# Patient Record
Sex: Female | Born: 1950 | Race: White | Hispanic: No | Marital: Married
Health system: Southern US, Community
[De-identification: ages and names within clinical notes are randomized; demographics above are authoritative.]

## PROBLEM LIST (undated history)

## (undated) DIAGNOSIS — K219 Gastro-esophageal reflux disease without esophagitis: Secondary | ICD-10-CM

## (undated) DIAGNOSIS — J45909 Unspecified asthma, uncomplicated: Secondary | ICD-10-CM

## (undated) DIAGNOSIS — M069 Rheumatoid arthritis, unspecified: Secondary | ICD-10-CM

## (undated) DIAGNOSIS — D509 Iron deficiency anemia, unspecified: Secondary | ICD-10-CM

## (undated) DIAGNOSIS — E039 Hypothyroidism, unspecified: Secondary | ICD-10-CM

## (undated) DIAGNOSIS — F411 Generalized anxiety disorder: Secondary | ICD-10-CM

---

## 2013-01-04 HISTORY — PX: BACK SURGERY: SHX140

## 2013-10-31 DIAGNOSIS — M1990 Unspecified osteoarthritis, unspecified site: Secondary | ICD-10-CM | POA: Insufficient documentation

## 2013-10-31 DIAGNOSIS — G4700 Insomnia, unspecified: Secondary | ICD-10-CM | POA: Insufficient documentation

## 2013-10-31 DIAGNOSIS — M5136 Other intervertebral disc degeneration, lumbar region: Secondary | ICD-10-CM | POA: Insufficient documentation

## 2013-10-31 DIAGNOSIS — I10 Essential (primary) hypertension: Secondary | ICD-10-CM | POA: Insufficient documentation

## 2013-10-31 DIAGNOSIS — J45909 Unspecified asthma, uncomplicated: Secondary | ICD-10-CM | POA: Insufficient documentation

## 2013-12-05 ENCOUNTER — Ambulatory Visit: Payer: Federal, State, Local not specified - PPO | Attending: Family Medicine | Admitting: Physical Therapy

## 2013-12-05 DIAGNOSIS — N8189 Other female genital prolapse: Secondary | ICD-10-CM | POA: Diagnosis present

## 2013-12-11 ENCOUNTER — Ambulatory Visit: Payer: Federal, State, Local not specified - PPO | Admitting: Physical Therapy

## 2013-12-18 ENCOUNTER — Ambulatory Visit: Payer: Federal, State, Local not specified - PPO | Admitting: Physical Therapy

## 2013-12-18 DIAGNOSIS — N8189 Other female genital prolapse: Secondary | ICD-10-CM | POA: Diagnosis not present

## 2013-12-24 ENCOUNTER — Ambulatory Visit: Payer: Federal, State, Local not specified - PPO | Admitting: Physical Therapy

## 2014-01-01 ENCOUNTER — Ambulatory Visit: Payer: Federal, State, Local not specified - PPO | Admitting: Physical Therapy

## 2014-01-08 ENCOUNTER — Ambulatory Visit: Payer: Federal, State, Local not specified - PPO | Attending: Family Medicine | Admitting: Physical Therapy

## 2014-01-08 DIAGNOSIS — N8189 Other female genital prolapse: Secondary | ICD-10-CM | POA: Insufficient documentation

## 2014-01-22 ENCOUNTER — Ambulatory Visit: Payer: Federal, State, Local not specified - PPO | Admitting: Physical Therapy

## 2014-01-22 DIAGNOSIS — N8189 Other female genital prolapse: Secondary | ICD-10-CM | POA: Diagnosis not present

## 2014-02-04 ENCOUNTER — Ambulatory Visit: Payer: Federal, State, Local not specified - PPO | Attending: Family Medicine | Admitting: Physical Therapy

## 2014-02-04 DIAGNOSIS — N8189 Other female genital prolapse: Secondary | ICD-10-CM | POA: Insufficient documentation

## 2014-02-07 ENCOUNTER — Other Ambulatory Visit: Payer: Self-pay | Admitting: Orthopaedic Surgery

## 2014-02-07 DIAGNOSIS — M4716 Other spondylosis with myelopathy, lumbar region: Secondary | ICD-10-CM

## 2014-02-07 DIAGNOSIS — M4316 Spondylolisthesis, lumbar region: Secondary | ICD-10-CM

## 2014-02-07 DIAGNOSIS — M545 Low back pain: Secondary | ICD-10-CM

## 2014-02-18 ENCOUNTER — Encounter: Payer: Federal, State, Local not specified - PPO | Admitting: Physical Therapy

## 2014-02-25 ENCOUNTER — Ambulatory Visit
Admission: RE | Admit: 2014-02-25 | Discharge: 2014-02-25 | Disposition: A | Discharge status: Home / Self Care | Payer: Federal, State, Local not specified - PPO | Source: Ambulatory Visit | Attending: Orthopaedic Surgery | Admitting: Orthopaedic Surgery

## 2014-02-25 DIAGNOSIS — M4316 Spondylolisthesis, lumbar region: Secondary | ICD-10-CM

## 2014-02-25 DIAGNOSIS — M4716 Other spondylosis with myelopathy, lumbar region: Secondary | ICD-10-CM

## 2014-02-25 DIAGNOSIS — M545 Low back pain: Secondary | ICD-10-CM

## 2014-03-04 ENCOUNTER — Ambulatory Visit: Payer: Federal, State, Local not specified - PPO | Admitting: Physical Therapy

## 2014-03-18 ENCOUNTER — Encounter: Payer: Federal, State, Local not specified - PPO | Admitting: Physical Therapy

## 2014-08-21 DIAGNOSIS — K219 Gastro-esophageal reflux disease without esophagitis: Secondary | ICD-10-CM | POA: Insufficient documentation

## 2014-08-21 DIAGNOSIS — R1310 Dysphagia, unspecified: Secondary | ICD-10-CM | POA: Insufficient documentation

## 2014-10-30 DIAGNOSIS — N3000 Acute cystitis without hematuria: Secondary | ICD-10-CM | POA: Insufficient documentation

## 2015-01-23 ENCOUNTER — Ambulatory Visit (HOSPITAL_COMMUNITY): Payer: Federal, State, Local not specified - PPO

## 2015-01-23 ENCOUNTER — Emergency Department (HOSPITAL_COMMUNITY): Payer: Federal, State, Local not specified - PPO

## 2015-01-23 ENCOUNTER — Observation Stay (HOSPITAL_COMMUNITY)
Admission: EM | Admit: 2015-01-23 | Discharge: 2015-01-25 | Disposition: A | Discharge status: Home / Self Care | Payer: Federal, State, Local not specified - PPO | Attending: Internal Medicine | Admitting: Internal Medicine

## 2015-01-23 ENCOUNTER — Encounter (HOSPITAL_COMMUNITY): Payer: Self-pay | Admitting: Emergency Medicine

## 2015-01-23 DIAGNOSIS — M549 Dorsalgia, unspecified: Secondary | ICD-10-CM | POA: Diagnosis not present

## 2015-01-23 DIAGNOSIS — F323 Major depressive disorder, single episode, severe with psychotic features: Secondary | ICD-10-CM

## 2015-01-23 DIAGNOSIS — F99 Mental disorder, not otherwise specified: Secondary | ICD-10-CM | POA: Diagnosis not present

## 2015-01-23 DIAGNOSIS — R4182 Altered mental status, unspecified: Principal | ICD-10-CM | POA: Diagnosis present

## 2015-01-23 DIAGNOSIS — E039 Hypothyroidism, unspecified: Secondary | ICD-10-CM | POA: Diagnosis not present

## 2015-01-23 DIAGNOSIS — M069 Rheumatoid arthritis, unspecified: Secondary | ICD-10-CM | POA: Diagnosis not present

## 2015-01-23 DIAGNOSIS — J45909 Unspecified asthma, uncomplicated: Secondary | ICD-10-CM | POA: Insufficient documentation

## 2015-01-23 DIAGNOSIS — E876 Hypokalemia: Secondary | ICD-10-CM | POA: Diagnosis not present

## 2015-01-23 DIAGNOSIS — R1110 Vomiting, unspecified: Secondary | ICD-10-CM | POA: Diagnosis not present

## 2015-01-23 DIAGNOSIS — M25462 Effusion, left knee: Secondary | ICD-10-CM

## 2015-01-23 DIAGNOSIS — Y92230 Patient room in hospital as the place of occurrence of the external cause: Secondary | ICD-10-CM | POA: Diagnosis not present

## 2015-01-23 DIAGNOSIS — R45851 Suicidal ideations: Secondary | ICD-10-CM | POA: Diagnosis not present

## 2015-01-23 DIAGNOSIS — F22 Delusional disorders: Secondary | ICD-10-CM | POA: Diagnosis not present

## 2015-01-23 DIAGNOSIS — Z87891 Personal history of nicotine dependence: Secondary | ICD-10-CM | POA: Diagnosis not present

## 2015-01-23 DIAGNOSIS — G8929 Other chronic pain: Secondary | ICD-10-CM | POA: Diagnosis not present

## 2015-01-23 DIAGNOSIS — W010XXA Fall on same level from slipping, tripping and stumbling without subsequent striking against object, initial encounter: Secondary | ICD-10-CM | POA: Insufficient documentation

## 2015-01-23 DIAGNOSIS — R40243 Glasgow coma scale score 3-8, unspecified time: Secondary | ICD-10-CM

## 2015-01-23 HISTORY — DX: Unspecified asthma, uncomplicated: J45.909

## 2015-01-23 HISTORY — DX: Rheumatoid arthritis, unspecified: M06.9

## 2015-01-23 LAB — COMPREHENSIVE METABOLIC PANEL
ALBUMIN: 3.9 g/dL (ref 3.5–5.0)
ALK PHOS: 117 U/L (ref 38–126)
ALT: 19 U/L (ref 14–54)
AST: 24 U/L (ref 15–41)
Anion gap: 10 (ref 5–15)
BILIRUBIN TOTAL: 0.2 mg/dL — AB (ref 0.3–1.2)
BUN: 15 mg/dL (ref 6–20)
CALCIUM: 10.6 mg/dL — AB (ref 8.9–10.3)
CO2: 27 mmol/L (ref 22–32)
CREATININE: 0.67 mg/dL (ref 0.44–1.00)
Chloride: 107 mmol/L (ref 101–111)
GFR calc Af Amer: >60 mL/min (ref >60)
GFR calc non Af Amer: >60 mL/min (ref >60)
GLUCOSE: 136 mg/dL — AB (ref 65–99)
Potassium: 3.4 mmol/L — ABNORMAL LOW (ref 3.5–5.1)
Sodium: 144 mmol/L (ref 135–145)
TOTAL PROTEIN: 6.7 g/dL (ref 6.5–8.1)

## 2015-01-23 LAB — CBC
HEMATOCRIT: 43.8 % (ref 36.0–46.0)
Hemoglobin: 14.1 g/dL (ref 12.0–15.0)
MCH: 31.4 pg (ref 26.0–34.0)
MCHC: 32.2 g/dL (ref 30.0–36.0)
MCV: 97.6 fL (ref 78.0–100.0)
PLATELETS: 295 10*3/uL (ref 150–400)
RBC: 4.49 MIL/uL (ref 3.87–5.11)
RDW: 13.8 % (ref 11.5–15.5)
WBC: 6.5 10*3/uL (ref 4.0–10.5)

## 2015-01-23 LAB — APTT: APTT: 30 s (ref 24–37)

## 2015-01-23 LAB — RAPID URINE DRUG SCREEN, HOSP PERFORMED
AMPHETAMINES: NOT DETECTED
Barbiturates: NOT DETECTED
Benzodiazepines: NOT DETECTED
Cocaine: NOT DETECTED
OPIATES: NOT DETECTED
TETRAHYDROCANNABINOL: NOT DETECTED

## 2015-01-23 LAB — PROTIME-INR
INR: 1.06 (ref 0.00–1.49)
Prothrombin Time: 14 seconds (ref 11.6–15.2)

## 2015-01-23 LAB — I-STAT TROPONIN, ED: Troponin i, poc: 0.02 ng/mL (ref 0.00–0.08)

## 2015-01-23 LAB — VITAMIN B12: Vitamin B-12: 1080 pg/mL — ABNORMAL HIGH (ref 180–914)

## 2015-01-23 LAB — URINALYSIS, ROUTINE W REFLEX MICROSCOPIC
Bilirubin Urine: NEGATIVE
Glucose, UA: NEGATIVE mg/dL
HGB URINE DIPSTICK: NEGATIVE
Ketones, ur: NEGATIVE mg/dL
Leukocytes, UA: NEGATIVE
Nitrite: NEGATIVE
Protein, ur: NEGATIVE mg/dL
SPECIFIC GRAVITY, URINE: 1.027 (ref 1.005–1.030)
pH: 5.5 (ref 5.0–8.0)

## 2015-01-23 LAB — FOLATE: FOLATE: 16.3 ng/mL (ref >5.9)

## 2015-01-23 LAB — ETHANOL: Alcohol, Ethyl (B): <5 mg/dL (ref <5)

## 2015-01-23 LAB — CBG MONITORING, ED: GLUCOSE-CAPILLARY: 123 mg/dL — AB (ref 65–99)

## 2015-01-23 LAB — TSH: TSH: 1.158 u[IU]/mL (ref 0.350–4.500)

## 2015-01-23 LAB — MAGNESIUM: Magnesium: 1.7 mg/dL (ref 1.7–2.4)

## 2015-01-23 MED ORDER — HYDROXYCHLOROQUINE SULFATE 200 MG PO TABS
200.0000 mg | ORAL_TABLET | Freq: Every day | ORAL | Status: DC
  Administered 2015-01-24 – 2015-01-25 (×2): 200 mg via ORAL
  Filled 2015-01-23 (×3): qty 1

## 2015-01-23 MED ORDER — METHOTREXATE 2.5 MG PO TABS: 20.0000 mg | ORAL_TABLET | ORAL | Status: DC

## 2015-01-23 MED ORDER — SODIUM CHLORIDE 0.9 % IV BOLUS (SEPSIS)
1000.0000 mL | Freq: Once | INTRAVENOUS | Status: AC
  Administered 2015-01-23: 1000 mL via INTRAVENOUS

## 2015-01-23 MED ORDER — LEVOTHYROXINE SODIUM 25 MCG PO TABS
25.0000 ug | ORAL_TABLET | Freq: Every day | ORAL | Status: DC
  Administered 2015-01-24 – 2015-01-25 (×2): 25 ug via ORAL
  Filled 2015-01-23 (×4): qty 1

## 2015-01-23 MED ORDER — TELMISARTAN-HCTZ 80-12.5 MG PO TABS: 1.0000 | ORAL_TABLET | Freq: Every day | ORAL | Status: DC

## 2015-01-23 MED ORDER — ALBUTEROL SULFATE (2.5 MG/3ML) 0.083% IN NEBU: 2.5000 mg | INHALATION_SOLUTION | Freq: Four times a day (QID) | RESPIRATORY_TRACT | Status: DC | PRN

## 2015-01-23 MED ORDER — ACETAMINOPHEN 325 MG PO TABS
650.0000 mg | ORAL_TABLET | Freq: Four times a day (QID) | ORAL | Status: DC | PRN
  Administered 2015-01-23 – 2015-01-25 (×2): 650 mg via ORAL
  Filled 2015-01-23 (×4): qty 2

## 2015-01-23 MED ORDER — IRBESARTAN 300 MG PO TABS
300.0000 mg | ORAL_TABLET | Freq: Every day | ORAL | Status: DC
  Administered 2015-01-23 – 2015-01-25 (×3): 300 mg via ORAL
  Filled 2015-01-23 (×4): qty 1

## 2015-01-23 MED ORDER — ALBUTEROL SULFATE HFA 108 (90 BASE) MCG/ACT IN AERS: 1.0000 | INHALATION_SPRAY | RESPIRATORY_TRACT | Status: DC | PRN

## 2015-01-23 MED ORDER — NALOXONE HCL 2 MG/2ML IJ SOSY
PREFILLED_SYRINGE | INTRAMUSCULAR | Status: AC
  Administered 2015-01-23: 2 mg
  Filled 2015-01-23: qty 2

## 2015-01-23 MED ORDER — HYDROCHLOROTHIAZIDE 12.5 MG PO CAPS
12.5000 mg | ORAL_CAPSULE | Freq: Every day | ORAL | Status: DC
  Administered 2015-01-24 – 2015-01-25 (×2): 12.5 mg via ORAL
  Filled 2015-01-23 (×3): qty 1

## 2015-01-23 MED ORDER — MONTELUKAST SODIUM 10 MG PO TABS
10.0000 mg | ORAL_TABLET | Freq: Every day | ORAL | Status: DC
Start: 2015-01-23 — End: 2015-01-25
  Filled 2015-01-23 (×2): qty 1

## 2015-01-23 MED ORDER — SODIUM CHLORIDE 0.9 % IV SOLN: INTRAVENOUS | Status: DC

## 2015-01-23 MED ORDER — ENOXAPARIN SODIUM 40 MG/0.4ML ~~LOC~~ SOLN
40.0000 mg | SUBCUTANEOUS | Status: DC
  Filled 2015-01-23 (×2): qty 0.4

## 2015-01-23 MED ORDER — POTASSIUM CHLORIDE IN NACL 40-0.9 MEQ/L-% IV SOLN
INTRAVENOUS | Status: AC
  Filled 2015-01-23 (×3): qty 1000

## 2015-01-23 MED ORDER — BUDESONIDE-FORMOTEROL FUMARATE 160-4.5 MCG/ACT IN AERO
2.0000 | INHALATION_SPRAY | Freq: Every day | RESPIRATORY_TRACT | Status: DC
Start: 2015-01-23 — End: 2015-01-25
  Filled 2015-01-23: qty 6

## 2015-01-23 NOTE — H&P (Signed)
Date: 01/23/2015               Patient Name:  Patricia Mora MRN: [id number]  DOB: 1950/04/26 Age / Sex: 65 y.o., female   PCP: Jolene Provost, MD         Medical Service: Internal Medicine Teaching Service         Attending Physician: Dr. Earl Lagos, MD    First Contact: Dr. Johnny Bridge Pager: [phone]  Second Contact: Dr. Danella Penton Pager: [phone]       After Hours (After 5p/  First Contact Pager: [phone]  weekends / holidays): Second Contact Pager: [phone]   Chief Complaint: Altered mental status  History of Present Illness: Pt si a 8 Y O F with PMH- Rheumatoid arthritis, Asthma, opioid dependence, thyroid gland dx- hypothyroidism, iron deficiency anemia, psychiatric dsd, chronic back pain. Presented today via EMS with altered mental status. History was gotten from husband as pt was drowsy, but more awake then when she came in. Both pt and her husband work for [employer]- night shift.  Pt was at work, dropped off by her husband at 7pm, was normal, per husband, but was found by her co-workers, wondering around, was not making any sense when she was questioned, husband talked to patient at about 11pm, she was Normal, but he came in around 3am, when co-workers called him that wife was acting abnormally. Pt says wife was just starring with her mouth open. Nobody noticed any jerking of her extremities. Pt vomited at work and also en route via EMS .No fever noted, no new complaints to husband recently- No cough, SOB, diarrhea.  Husband does note that pt has been in increased amount of pain from her back and knee, and that she has been taking an increased amount of pain meds. She has had back surgery- ~1 and a half year ago, and has a pain contract, but she runs out of her meds 1-2 weeks before she is due for a refill. She is also on a number of sedating drugs, muscle relaxants, but husband does not know how she takes it, or if she took overdose. Pt lost her brother 2 days ago to alzheimers dx, and she  has been depressed. Pt has also been very paranoid, accusing the husband of many things like, trying to poison her, so she has not been eating. Per husband pt has lost ~30lbs in the past 4 months, from not eating.  Pt had a similar episode- October of this year- where she took opioids and muscle relaxants , and had a psychotic reaction, for which she was admitted at [facility] from the 10/24 to 10/27 2016.  Pt quit smoking cigs- 23yrs ago, very rarely takes alcohol, doesn't use drugs.   Meds: Current Facility-Administered Medications  Medication Dose Route Frequency Provider Last Rate Last Dose  . 0.9 %  sodium chloride infusion   Intravenous STAT Kristen N Ward, DO       Current Outpatient Prescriptions  Medication Sig Dispense Refill  . albuterol (PROAIR HFA) 108 (90 Base) MCG/ACT inhaler Inhale 1-2 puffs into the lungs every 4 (four) hours as needed for wheezing or shortness of breath.     Marland Kitchen amitriptyline (ELAVIL) 25 MG tablet Take 1-2 tablets by mouth at bedtime.  2  . ARIPiprazole (ABILIFY) 2 MG tablet Take 1 tablet by mouth daily.  0  . aspirin-acetaminophen-caffeine (EXCEDRIN MIGRAINE) 250-250-65 MG tablet Take 1-2 tablets by mouth 2 (two) times daily as needed for migraine.     Marland Kitchen  baclofen (LIORESAL) 10 MG tablet Take 1 tablet by mouth 3 (three) times daily.  5  . budesonide-formoterol (SYMBICORT) 160-4.5 MCG/ACT inhaler Inhale 2 puffs into the lungs daily.    . capsaicin (ZOSTRIX) 0.025 % cream Apply 1 application topically 2 (two) times daily as needed (pain).     . carisoprodol (SOMA) 350 MG tablet Take 1 tablet by mouth 3 (three) times daily as needed for muscle spasms.   3  . conjugated estrogens (PREMARIN) vaginal cream Place 1 application vaginally every Monday, Wednesday, and Friday.    . folic acid (FOLVITE) 1 MG tablet Take 1 mg by mouth daily.    Marland Kitchen gabapentin (NEURONTIN) 300 MG capsule Take 300 mg by mouth 3 (three) times daily.    . hydroxychloroquine (PLAQUENIL) 200  MG tablet Take 1 tablet by mouth daily.    Marland Kitchen levothyroxine (SYNTHROID, LEVOTHROID) 25 MCG tablet Take 1 tablet by mouth daily.    . meloxicam (MOBIC) 15 MG tablet Take 1 tablet by mouth daily.    . methotrexate (RHEUMATREX) 2.5 MG tablet Take 8 tablets by mouth once a week.  0  . montelukast (SINGULAIR) 10 MG tablet Take 1 tablet by mouth at bedtime.    . naloxegol oxalate (MOVANTIK) 25 MG TABS tablet Take 1 tablet by mouth daily.    Marland Kitchen oxyCODONE (ROXICODONE) 15 MG immediate release tablet Take 1 tablet by mouth daily.  0  . Polyethylene Glycol 3350 POWD Take 5 mLs by mouth daily.    . sertraline (ZOLOFT) 100 MG tablet Take 1 tablet by mouth daily.  11  . telmisartan-hydrochlorothiazide (MICARDIS HCT) 80-12.5 MG tablet Take 1 tablet by mouth daily.  0  . tiZANidine (ZANAFLEX) 2 MG tablet Take 1 tablet by mouth 3 (three) times daily as needed for muscle spasms.   2  . zolpidem (AMBIEN) 10 MG tablet Take 10 mg by mouth at bedtime.      Allergies: Allergies as of 01/23/2015  . (No Known Allergies)   Past Medical History  Diagnosis Date  . RA (rheumatoid arthritis) (HCC)   . Asthma    Past Surgical History  Procedure Laterality Date  . Back surgery  2015    Lumbar fusion   History reviewed. No pertinent family history. Social History   Social History  . Marital Status: Married    Spouse Name: N/A  . Number of Children: N/A  . Years of Education: N/A   Occupational History  . Not on file.   Social History Main Topics  . Smoking status: Unknown If Ever Smoked  . Smokeless tobacco: Not on file  . Alcohol Use: No  . Drug Use: No  . Sexual Activity: Not on file   Other Topics Concern  . Not on file   Social History Narrative  . No narrative on file    Review of Systems: Could not be obtained due to patients mental status.  Physical Exam: Blood pressure 157/89, pulse 73, temperature 97.7 F (36.5 C), temperature source Oral, resp. rate 11, SpO2 96 %. GENERAL- Sleepy,  arousable to persistent firm touch, appears as stated age, not in any distress. HEENT- Atraumatic, normocephalic, pupils dilated mildly and sluggishly reactive to light, oral mucosa appears moist, CARDIAC- RRR, no murmurs, rubs or gallops. RESP- Moving equal volumes of air, and clear to auscultation bilaterally, no wheezes or crackles- anterior auscultation. ABDOMEN- Soft, nontender, no guarding or rebound, no palpable masses or organomegaly, bowel sounds present. NEURO- unable to assess. EXTREMITIES- pulse 2+, symmetric,  no pedal edema, left knee enlargement/swelling, firm, ?non tender, no erythema, no differential warmth, unchanged from prior per husband, first metatarso-phalangeal joint appears enlarged bilat, but without effusion, enlarged knuckles, no acute abnormality.  SKIN- Warm, dry, No rash or lesion. PSYCH- sleepy.  Lab results: Basic Metabolic Panel:  Recent Labs  84/66/59 0507  NA 144  K 3.4*  CL 107  CO2 27  GLUCOSE 136*  BUN 15  CREATININE 0.67  CALCIUM 10.6*   Liver Function Tests:  Recent Labs  01/23/15 0507  AST 24  ALT 19  ALKPHOS 117  BILITOT 0.2*  PROT 6.7  ALBUMIN 3.9   CBC:  Recent Labs  01/23/15 0507  WBC 6.5  HGB 14.1  HCT 43.8  MCV 97.6  PLT 295   CBG:  Recent Labs  01/23/15 0452  GLUCAP 123*   Coagulation:  Recent Labs  01/23/15 0520  LABPROT 14.0  INR 1.06   Urine Drug Screen: Drugs of Abuse     Component Value Date/Time   LABOPIA NONE DETECTED 01/23/2015 0600   COCAINSCRNUR NONE DETECTED 01/23/2015 0600   LABBENZ NONE DETECTED 01/23/2015 0600   AMPHETMU NONE DETECTED 01/23/2015 0600   THCU NONE DETECTED 01/23/2015 0600   LABBARB NONE DETECTED 01/23/2015 0600    Alcohol Level:  Recent Labs  01/23/15 0512  ETH <5   Urinalysis:  Recent Labs  01/23/15 0600  COLORURINE YELLOW  LABSPEC 1.027  PHURINE 5.5  GLUCOSEU NEGATIVE  HGBUR NEGATIVE  BILIRUBINUR NEGATIVE  KETONESUR NEGATIVE  PROTEINUR NEGATIVE   NITRITE NEGATIVE  LEUKOCYTESUR NEGATIVE   Imaging results:  Ct Head Wo Contrast  01/23/2015  CLINICAL DATA:  65 year old female found unresponsive and combative. EXAM: CT HEAD WITHOUT CONTRAST TECHNIQUE: Contiguous axial images were obtained from the base of the skull through the vertex without intravenous contrast. COMPARISON:  None. FINDINGS: The ventricles are dilated and the sulci are prominent compatible with age-related atrophy. Periventricular and deep white matter hypodensities represent chronic microvascular ischemic changes. There is no intracranial hemorrhage. No mass effect or midline shift identified. The visualized paranasal sinuses are well aerated. There is opacification of multiple left mastoid air cells. The right mastoid air cells are clear. The calvarium is intact. IMPRESSION: No acute intracranial hemorrhage. Age-related atrophy and chronic microvascular ischemic disease. If symptoms persist and there are no contraindications, MRI may provide better evaluation if clinically indicated. These results were called by telephone at the time of interpretation on 01/23/2015 at 5:42 am to Dr. Rochele Raring , who verbally acknowledged these results. Electronically Signed   By: Elgie Collard M.D.   On: 01/23/2015 05:45   Mr Brain Wo Contrast  01/23/2015  CLINICAL DATA:  Altered mental status. EXAM: MRI HEAD WITHOUT CONTRAST TECHNIQUE: Multiplanar, multiecho pulse sequences of the brain and surrounding structures were obtained without intravenous contrast. COMPARISON:  CT head without contrast 01/23/2015 FINDINGS: The diffusion-weighted images demonstrate no evidence for acute or subacute infarction. Moderate atrophy and periventricular white matter disease is advanced for age. Moderate scattered subcortical white matter disease is present as well. Remote ischemic lesions are present in the thalami bilaterally. The brainstem and cerebellum are unremarkable. Internal auditory canals are within  normal limits. Flow is present in the major intracranial arteries. The globes and orbits are intact. The paranasal sinuses are clear. Bilateral mastoid effusions are noted. No obstructing nasopharyngeal lesions are present. The skullbase is within normal limits. Degenerative changes are present in the upper cervical spine. Midline sagittal images are otherwise unremarkable. IMPRESSION:  1. No acute intracranial abnormality. 2. Moderate age advanced atrophy and white matter disease. This likely reflects the sequela of chronic microvascular ischemia. 3. Degenerate changes in the upper cervical spine. Electronically Signed   By: Marin Roberts M.D.   On: 01/23/2015 11:27   Dg Chest Portable 1 View  01/23/2015  CLINICAL DATA:  65 year old female with altered mental status EXAM: PORTABLE CHEST 1 VIEW COMPARISON:  Radiograph dated 12/18/2014 FINDINGS: Single-view of the chest demonstrates clear lungs. No pleural effusion or pneumothorax. The cardiac silhouette is within normal limits. There is osteopenia with degenerative changes of the spine. Bilateral chronic rotator cuff injury. IMPRESSION: No active disease. Electronically Signed   By: Elgie Collard M.D.   On: 01/23/2015 05:26    Other results: EKG: RAte- 70bpm, PR- 181, QRS- 88, Qtc- 493 prolonged, no ST abnormlaities, T waves upright. No prior tracing.   Assessment & Plan by Problem: Principal Problem:   Altered mental status Active Problems:   Rheumatoid arthritis (HCC)   Psychiatric disorder   Chronic pain   Vomiting   Hypothyroidism  Altered mental Status- Likely from overdose of multiple medication- Amitriptyline, sertraline, Aripiprazole, Baclofen, carisoprodol, gabapentin, tizanidine and zolpidem. Consider intentional overdose, as she has been in increasing amount of pain and more depressed due to recent loss of her brother. Of particular concern among others- TCA overdose-Amitrptyline-which can cause sedation, dilated pupils, and  reduced bowel sounds, which patient presently has, but pt QRS is 88. Also monitor for toxicity from SSRIs- Sertraline. Initially Code stroke activated in the ED, Head CT and MRI not suggestive of cause. UDS- negative for opioids or any other drugs, likely pt ran out of pain meds- Oxycodone. Ethanol neg. Doubt seizures, as post ictal phase appears too long, except if pt is having on-going seizure activity. - Admit to tele - Psych consult am - Repeat EKG- check QRS duration, Qtc interval - With Altered mental status, Recent weight loss and poor intake- check B12 and folate - TSH - Hold all sedating medications for now and muscle relaxants - Pts husband to bring pill bottles, for pill check/count. - EEG - Neuro consulted in the ED, recs appreciated. - NPO  Hypokalemia- k- 3.4, likely due to vomiting- from intoxications. - Replete with IVF- N/s +Kcl  Rheumatoid Arthritis- On methotrexate, Hydroxychloroquine and per husband low dose prednisone.  - Cont methotrexate and hydroxychloroquine - Hold pain meds  Psychiatric disorder- with paranoia. Husband does not know patient diagnosis. Pt is on aripiprzole, Sertraline and Amitriptyline.  - Hold meds for now - Psych consult  Hypothyroidism- on synthroid- 25mg  daily - TSH check - cont synthroid  Dispo: Disposition is deferred at this time, awaiting improvement of current medical problems.  Signed: Onnie Boer, MD 01/23/2015, 1:07 PM

## 2015-01-23 NOTE — ED Notes (Signed)
Pt transported to EEG - EEG will transport pt to floor after testing.

## 2015-01-23 NOTE — Progress Notes (Signed)
Pt refused EEG at this time.  Will check in the AM.

## 2015-01-23 NOTE — ED Notes (Signed)
[facility] Called @ 0511/Code Stroke activate.

## 2015-01-23 NOTE — Progress Notes (Signed)
Internal Medicine Night Float Interim Progress Note  S: Called by nursing this evening after patient was found on the floor with her clothes off. Patient denied any injury. I went to evaluate the patient and she is laying comfortably in bed. She states she slipped getting out of bed. She denies any injury or hitting her head. She complains of chronic leg weakness, but isn't sure if her weakness caused her to fall. She denied associated lightheadedness or dizziness.   O: Physical Exam Filed Vitals:   01/23/15 1325 01/23/15 1439 01/23/15 1721 01/23/15 1841  BP: 120/98 148/103 163/81 170/86  Pulse: 78 80 79 89  Temp:   98.3 F (36.8 C) 98 F (36.7 C)  TempSrc:   Oral Oral  Resp: 15 9 16 18   SpO2: 98% 94% 97% 98%   General: Vital signs reviewed.  Patient is thin, in no acute distress and cooperative with exam.  Musculoskeletal: +moderate joint effusion in left knee, increased warmth no erythema Extremities: No lower extremity edema bilaterally. Nurological: A&O x3, 5/5 strength in right lower extremity, 3/5 strength in left lower extremity (d/t patient effort?) cranial nerve II-XII are grossly intact.  Skin: Warm, dry and intact. No ecchymoses.  Psychiatric: Abnormal mood and affect-agitated, non-sensical. speech and behavior is normal. Cognition and memory are normal.   A/P:  Fall: No resulting injuries, likely from left lower extremity weakness resulting in a mechanical fall. Patient did not hit her head. No focal deficits on examination, but possible LLE weakness although I question poor patient effort as she is normally ambulatory at baseline. Patient is now on bed rest and has a . I will order PT/OT. MRI without acute abnormalities. Upon review of previous imaging, patient had an MRI of lumbar spine back in February for "left leg pain and numbness." MRI on 02/25/14 showed interval posterior decompression and fusion L3-4, L4-5, and L5-S1. Significant posterior decompression of the  central canal at L3-4, L4-5, and L5-S1. Given the and artifact from hardware and potential residual foraminal stenosis, CT myelography may be useful for further evaluation in the appropriate clinical setting. -Bed Rest -PT/OT -Sitter -Could consider CT myelography if LLE continues to be an acute issue during inpatient stay  Left Knee Effusion: Sounds chronic and recurrent as patient states it has been drained in the outpatient setting about 2 weeks ago.  -Could consider draining effusion verus follow up with PCP as not an acute issue  02/27/14, DO PGY-2 Internal Medicine Resident Pager # [phone] 01/23/2015 10:32 PM

## 2015-01-23 NOTE — Progress Notes (Signed)
Patient refusing medications, IV fluids, respiratory treatment this evening. Pt educated and encouraged with no success. Will continue to monitor. Jodee Wagenaar, [place] Scrape, RN

## 2015-01-23 NOTE — ED Provider Notes (Signed)
TIME SEEN: 5:00 AM  CHIEF COMPLAINT: altered mental status  HPI: Pt is a 65 y.o. female with history of rheumatoid arthritis on methotrexate, asthma, chronic back pain on hydrocodone and Soma who presents to the emergency department with altered mental status. Patient went to work around 7 PM last night per her husband's report and was normal. He states he talked to her at work at 54 PM and she sounded normal. Coworkers called EMS because they were concerned because she was not acting herself, wandering around. With EMS she was combative and received 2.5 mg of IV Haldol and then 4 mg of IV Zofran because she began vomiting. She is now drowsy, difficult to arouse with painful stimuli.    ROS: level V caveat for altered mental status  PAST MEDICAL HISTORY/PAST SURGICAL HISTORY:  Past Medical History  Diagnosis Date  . RA (rheumatoid arthritis) (HCC)   . Asthma     MEDICATIONS:  Prior to Admission medications   Not on File    ALLERGIES:  No Known Allergies  SOCIAL HISTORY:  Social History  Substance Use Topics  . Smoking status: Unknown If Ever Smoked  . Smokeless tobacco: Not on file  . Alcohol Use: No    FAMILY HISTORY: History reviewed. No pertinent family history.  EXAM: BP 150/90 mmHg  Pulse 71  Temp(Src) 97.7 F (36.5 C) (Oral)  Resp 12  SpO2 97% CONSTITUTIONAL: patient will localize to painful stimuli and grimaces, she does does sometimes open her eyes to painful stimuli, does not follow commands or talk, GCS 8 HEAD: Normocephalic, atraumatic EYES: Conjunctivae clear, pupils are pinpoint bilaterally ENT: normal nose; no rhinorrhea; moist mucous membranes; pharynx without lesions noted NECK: Supple, no meningismus, no LAD, no midline step-off or deformity  CARD: RRR; S1 and S2 appreciated; no murmurs, no clicks, no rubs, no gallops RESP: Normal chest excursion without splinting or tachypnea; breath sounds clear and equal bilaterally; no wheezes, no rhonchi, no rales,  no hypoxia or respiratory distress, speaking full sentences ABD/GI: Normal bowel sounds; non-distended; soft, non-tender, no rebound, no guarding, no peritoneal signs BACK:  The back appears normal and there is no midline step-off or deformity or other lesions EXT: Normal ROM in all joints; non-tender to palpation; no edema; normal capillary refill; no cyanosis, no calf tenderness or swelling    SKIN: Normal color for age and race; warm NEURO: Moves all extremities equally, does not answer questions or follow commands, does open eyes intermittently to painful stimuli, GCS 8   MEDICAL DECISION MAKING: Patient here with altered mental status. Unclear etiology. No improvement with Narcan. Blood glucose is normal at 123. Vital signs unremarkable. No sign of trauma on exam. Given husband reports he last talked to her and she was normal around 11 PM we have activated a code stroke. Labs, head CT, chest x-ray, urine pending. EKG shows slightly prolonged QT interval. No other acute abnormality.  ED PROGRESS:   5:30 AM  Pt's pupils are now equal and reactive. She is opening her eyes spontaneously. Moving all extremities purposefully but not talking or following commands yet.  CT head shows no acute abnormality. Dr. Roseanne Reno with neurology has seen patient and his canceling the code stroke given slow improvement in AMS. Labs unremarkable.  8:30 AM  Pt's urine shows no sign of infection in urine drug screen is negative. She now will arouse to just minimal painful stimuli and open her eyes, smile but is still not answering questions. Differential includes medication effects, stroke,  seizure. No sign of infection. Doubt meningitis, encephalitis. Dr. Roseanne Reno with neurology has recommended MRI of patient's brain, EEG. Discussed this with patient's husband. We'll admit to medicine. He reports that she had a similar episode in the past at [facility]. [place] obtain records from their hospital. Patient's PCP is in  [facility] with Dr. William Hamburger.  Will admit to medicine.  10:10 AM  D/w IM resident teaching service.  They agree on admission to tele, obs.  I will place holding orders per their request.  Attending is Dr. Heide Spark.   EKG Interpretation  Date/Time:  Thursday January 23 2015 05:05:28 EST Ventricular Rate:  70 PR Interval:  181 QRS Duration: 88 QT Interval:  457 QTC Calculation: 493 R Axis:   51 Text Interpretation:  Sinus arrhythmia Borderline prolonged QT interval Confirmed by Patricia Mora,  Patricia Mora, Patricia Mora [phone]) on 01/23/2015 5:09:53 AM         Patricia Mora Patricia Mora, Patricia Mora 01/23/15 1013

## 2015-01-23 NOTE — ED Notes (Signed)
Report given to RN on 5M 

## 2015-01-23 NOTE — ED Notes (Signed)
Pt presents with [facility] from work [employer]) for AMS where LSN time was 1900 yesterday (01/22/2015); coworkers reported to EMS that patient "has been wandering around work since [employer]"; EMS reports patient was unreposnive, combative, and not following commands enroute; pt began vomiting in the back of the ambulance and 4mg  zofran was given along with 2.5mg  haldol; pt now sleeping; VSS enroute;

## 2015-01-23 NOTE — Consult Note (Signed)
Admission H&P    Chief Complaint: Altered mental status.  HPI: Patricia Mora is an 65 y.o. female with a history of rheumatoid arthritis and asthma brought to the emergency room with altered mental status. Patient was last known well at 43 PM when she talked with her spouse by phone. She reportedly was wandering around at work and appeared confused. She was unresponsive when EMS arrived. She reportedly takes hydrocodone for pain. She had vomiting in route to the ED and was given Zofran. Pupils are noted to be pinpoint in the ED. She was given Narcan and subsequently became agitated but still confused and did not follow commands. CT scan of her head showed no acute intracranial abnormality. Code stroke was activated and subsequently canceled. Urine drug screen is pending.  Past Medical History  Diagnosis Date  . RA (rheumatoid arthritis) ([place])   . Asthma     Past Surgical History  Procedure Laterality Date  . Back surgery  2015    Lumbar fusion    History reviewed. No pertinent family history. Social History:  reports that she does not drink alcohol or use illicit drugs. Her tobacco history is not on file.  Allergies: No Known Allergies  Medications: Patient's preadmission medications were reviewed with patient spouse.  ROS: History obtained from the patient  General ROS: negative for - chills, fatigue, fever, night sweats, weight gain or weight loss Psychological ROS: negative for - behavioral disorder, hallucinations, memory difficulties, mood swings or suicidal ideation Ophthalmic ROS: negative for - blurry vision, double vision, eye pain or loss of vision ENT ROS: negative for - epistaxis, nasal discharge, oral lesions, sore throat, tinnitus or vertigo Allergy and Immunology ROS: negative for - hives or itchy/watery eyes Hematological and Lymphatic ROS: negative for - bleeding problems, bruising or swollen lymph nodes Endocrine ROS: negative for - galactorrhea, hair pattern  changes, polydipsia/polyuria or temperature intolerance Respiratory ROS: negative for - cough, hemoptysis, shortness of breath or wheezing Cardiovascular ROS: negative for - chest pain, dyspnea on exertion, edema or irregular heartbeat Gastrointestinal ROS: negative for - abdominal pain, diarrhea, hematemesis, nausea/vomiting or stool incontinence Genito-Urinary ROS: negative for - dysuria, hematuria, incontinence or urinary frequency/urgency Musculoskeletal ROS: Chronic pain associated with rheumatoid arthritis Neurological ROS: as noted in HPI Dermatological ROS: negative for rash and skin lesion changes  Physical Examination: Blood pressure 155/82, pulse 68, temperature 97.7 F (36.5 C), temperature source Oral, resp. rate 17, SpO2 88 %.  HEENT-  Normocephalic, no lesions, without obvious abnormality.  Normal external eye and conjunctiva.  Normal TM's bilaterally.  Normal auditory canals and external ears. Normal external nose, mucus membranes and septum.  Normal pharynx. Neck supple with no masses, nodes, nodules or enlargement. Cardiovascular - regular rate and rhythm, S1, S2 normal, no murmur, click, rub or gallop Lungs - chest clear, no wheezing, rales, normal symmetric air entry Abdomen - soft, non-tender; bowel sounds normal; no masses,  no organomegaly Extremities - no edema and no skin discoloration  Neurologic Examination: Mental Status: Patient was markedly agitated and had no speech output. She did not follow simple commands. Visual fixation and ocular tracking were noted. Cranial Nerves: II-Visual fields - unable to adequately assess. III/IV/VI-Pupils were normal in size and equal, and reacted normally to light. Extraocular movements were full and conjugate.    VII- no facial weakness. VIII-normal. X-no speech output. XI: trapezius strength/neck flexion strength normal bilaterally Motor: 5/5 bilaterally with normal tone and bulk Deep Tendon Reflexes: Absent  throughout. Plantars: Flexor bilaterally  Results for orders placed or performed during the hospital encounter of 01/23/15 (from the past 48 hour(s))  CBG monitoring, ED     Status: Abnormal   Collection Time: 01/23/15  4:52 AM  Result Value Ref Range   Glucose-Capillary 123 (H) 65 - 99 mg/dL   Comment 1 Notify RN    Comment 2 Document in Chart   Comprehensive metabolic panel     Status: Abnormal   Collection Time: 01/23/15  5:07 AM  Result Value Ref Range   Sodium 144 135 - 145 mmol/L   Potassium 3.4 (L) 3.5 - 5.1 mmol/L   Chloride 107 101 - 111 mmol/L   CO2 27 22 - 32 mmol/L   Glucose, Bld 136 (H) 65 - 99 mg/dL   BUN 15 6 - 20 mg/dL   Creatinine, Ser 0.67 0.44 - 1.00 mg/dL   Calcium 10.6 (H) 8.9 - 10.3 mg/dL   Total Protein 6.7 6.5 - 8.1 g/dL   Albumin 3.9 3.5 - 5.0 g/dL   AST 24 15 - 41 U/L   ALT 19 14 - 54 U/L   Alkaline Phosphatase 117 38 - 126 U/L   Total Bilirubin 0.2 (L) 0.3 - 1.2 mg/dL   GFR calc non Af Amer >60 >60 mL/min   GFR calc Af Amer >60 >60 mL/min    Comment: (NOTE) The eGFR has been calculated using the CKD EPI equation. This calculation has not been validated in all clinical situations. eGFR's persistently <60 mL/min signify possible Chronic Kidney Disease.    Anion gap 10 5 - 15  CBC     Status: None   Collection Time: 01/23/15  5:07 AM  Result Value Ref Range   WBC 6.5 4.0 - 10.5 K/uL   RBC 4.49 3.87 - 5.11 MIL/uL   Hemoglobin 14.1 12.0 - 15.0 g/dL   HCT 43.8 36.0 - 46.0 %   MCV 97.6 78.0 - 100.0 fL   MCH 31.4 26.0 - 34.0 pg   MCHC 32.2 30.0 - 36.0 g/dL   RDW 13.8 11.5 - 15.5 %   Platelets 295 150 - 400 K/uL  I-stat troponin, ED     Status: None   Collection Time: 01/23/15  5:07 AM  Result Value Ref Range   Troponin i, poc 0.02 0.00 - 0.08 ng/mL   Comment 3            Comment: Due to the release kinetics of cTnI, a negative result within the first hours of the onset of symptoms does not rule out myocardial infarction with certainty. If  myocardial infarction is still suspected, repeat the test at appropriate intervals.   Ethanol     Status: None   Collection Time: 01/23/15  5:12 AM  Result Value Ref Range   Alcohol, Ethyl (B) <5 <5 mg/dL    Comment:        LOWEST DETECTABLE LIMIT FOR SERUM ALCOHOL IS 5 mg/dL FOR MEDICAL PURPOSES ONLY   APTT     Status: None   Collection Time: 01/23/15  5:20 AM  Result Value Ref Range   aPTT 30 24 - 37 seconds  Protime-INR     Status: None   Collection Time: 01/23/15  5:20 AM  Result Value Ref Range   Prothrombin Time 14.0 11.6 - 15.2 seconds   INR 1.06 0.00 - 1.49   Ct Head Wo Contrast  01/23/2015  CLINICAL DATA:  65 year old female found unresponsive and combative. EXAM: CT HEAD WITHOUT CONTRAST TECHNIQUE: Contiguous axial images  were obtained from the base of the skull through the vertex without intravenous contrast. COMPARISON:  None. FINDINGS: The ventricles are dilated and the sulci are prominent compatible with age-related atrophy. Periventricular and deep white matter hypodensities represent chronic microvascular ischemic changes. There is no intracranial hemorrhage. No mass effect or midline shift identified. The visualized paranasal sinuses are well aerated. There is opacification of multiple left mastoid air cells. The right mastoid air cells are clear. The calvarium is intact. IMPRESSION: No acute intracranial hemorrhage. Age-related atrophy and chronic microvascular ischemic disease. If symptoms persist and there are no contraindications, MRI may provide better evaluation if clinically indicated. These results were called by telephone at the time of interpretation on 01/23/2015 at 5:42 am to Dr. Pryor Curia , who verbally acknowledged these results. Electronically Signed   By: Anner Crete M.D.   On: 01/23/2015 05:45   Dg Chest Portable 1 View  01/23/2015  CLINICAL DATA:  65 year old female with altered mental status EXAM: PORTABLE CHEST 1 VIEW COMPARISON:  Radiograph dated  12/18/2014 FINDINGS: Single-view of the chest demonstrates clear lungs. No pleural effusion or pneumothorax. The cardiac silhouette is within normal limits. There is osteopenia with degenerative changes of the spine. Bilateral chronic rotator cuff injury. IMPRESSION: No active disease. Electronically Signed   By: Anner Crete M.D.   On: 01/23/2015 05:26    Assessment/Plan 65 year old lady presenting with altered mental status of unclear etiology but likely related to medication overdose, given her initial pinpoint pupils and arousal with Narcan. Urine drug screen is pending. Patient had no focal neurologic deficits.  Recommendations: 1. MRI of the brain to rule out acute ischemic lesion 2. EEG rule out possible focal seizure activity, as well as assess severity of encephalopathy.  C.R. Nicole Kindred, MD [facility] [phone]  01/23/2015, 6:00 AM

## 2015-01-23 NOTE — Progress Notes (Addendum)
Patient has claimed more than one time in the past few minutes that she wants to kill herself.  Pt is fighting with her husband in the room and trying to swing at him. I went into the room and she became very agitated. MD was paged and a suicide sitter order was placed. RN and NT trying to clear out the room at this time. Monitoring closely. Vidit Boissonneault, [place] Scrape, RN   Room cleared and suicide sitter at the bedside. Pt very angry and agitated with RN and NT.

## 2015-01-23 NOTE — ED Notes (Signed)
Husband at bedside states that he called patient at 11pm last night (01/22/15) where patient was speaking/talking/acting normally; husband spoke with coworkers around 2am this morning (01/23/15) who stated that patient was not acting herself and wandering around

## 2015-01-23 NOTE — ED Notes (Signed)
This RN made aware by Diplomatic Services operational officer by phonecall that pt needs to use restroom. This RN went into room to assist pt to bedpan and tech already at bedside to assist. Pt states "this place takes horrible care of patients - no one has come when I called." Pt husband at bedside and stated to pt that she had not called out multiple times. This RN reminded pt staff had rounded on room every hour to assist pt with no requests made known to staff. Crecencio Mc, Charge RN aware.

## 2015-01-23 NOTE — ED Notes (Signed)
Faxed Release of Info to [facility]

## 2015-01-23 NOTE — Progress Notes (Signed)
   01/23/15 1900  What Happened  Was fall witnessed? No  Was patient injured? No  Patient found on floor  Found by Staff-comment Drenda Freeze Kevante Lunt RN and Rico Ala NT)  Stated prior activity other (comment) (bedrest orders)  Follow Up  MD notified Yes   Time MD notified 1850  Family notified Yes-comment (husband Marionette Meskill)  Time family notified 2001  Additional tests No  Progress note created (see row info) Yes  Adult Fall Risk Assessment  Risk Factor Category (scoring not indicated) High fall risk per protocol (document High fall risk)  Patient's Fall Risk High Fall Risk (>13 points)  Adult Fall Risk Interventions  Required Bundle Interventions *See Row Information* High fall risk - low, moderate, and high requirements implemented  Additional Interventions Fall risk signage;Individualized elimination schedule;Use of appropriate toileting equipment (bedpan, BSC, etc.);Room near nurses station  Fall with Injury Screening  Risk For Fall Injury- See Row Information  Nurse judgement  Intervention(s) for 2 or more risk criteria identified Low Bed;Gait Belt  Vitals  Temp (see vitals at 1841)  ECG Heart Rate 86  Cardiac Rhythm NSR  Pain Assessment  Pain Assessment PAINAD  PAINAD (Pain Assessment in Advanced Dementia)  Breathing 0  Negative Vocalization 0  Facial Expression 0  Body Language 2  Consolability 2  PAINAD Score 4  Neurological  Level of Consciousness Alert  Orientation Level Oriented X4  Cognition Follows commands;Poor safety awareness;Poor judgement;Poor attention/concentration  Speech Clear  R Pupil Size (mm) 2  R Pupil Reaction Brisk  L Pupil Size (mm) 2  L Pupil Reaction Brisk  Facial Symmetry Symmetrical  R Hand Grip Moderate  L Hand Grip Moderate   R Foot Dorsiflexion Weak  L Foot Dorsiflexion Weak  R Foot Plantar Flexion Weak  L Foot Plantar Flexion Weak  Glasgow Coma Scale  Eye Opening 4  Best Verbal Response (NON-intubated) 5  Best Motor  Response 6  Glasgow Coma Scale Score 15  Musculoskeletal  Musculoskeletal (WDL) X  Assistive Device None  Generalized Weakness Yes  Weight Bearing Restrictions No  Integumentary  Integumentary (WDL) X  Skin Color Appropriate for ethnicity  Skin Condition Dry;Flaky  Skin Integrity Intact  Skin Turgor Non-tenting

## 2015-01-24 ENCOUNTER — Ambulatory Visit (HOSPITAL_COMMUNITY): Payer: Federal, State, Local not specified - PPO

## 2015-01-24 DIAGNOSIS — F333 Major depressive disorder, recurrent, severe with psychotic symptoms: Secondary | ICD-10-CM

## 2015-01-24 DIAGNOSIS — R4182 Altered mental status, unspecified: Secondary | ICD-10-CM | POA: Diagnosis not present

## 2015-01-24 DIAGNOSIS — E039 Hypothyroidism, unspecified: Secondary | ICD-10-CM | POA: Diagnosis not present

## 2015-01-24 DIAGNOSIS — R400 Somnolence: Secondary | ICD-10-CM

## 2015-01-24 DIAGNOSIS — M25462 Effusion, left knee: Secondary | ICD-10-CM | POA: Diagnosis not present

## 2015-01-24 DIAGNOSIS — E876 Hypokalemia: Secondary | ICD-10-CM | POA: Diagnosis not present

## 2015-01-24 LAB — BASIC METABOLIC PANEL
Anion gap: 13 (ref 5–15)
BUN: 6 mg/dL (ref 6–20)
CHLORIDE: 102 mmol/L (ref 101–111)
CO2: 24 mmol/L (ref 22–32)
CREATININE: 0.45 mg/dL (ref 0.44–1.00)
Calcium: 11.3 mg/dL — ABNORMAL HIGH (ref 8.9–10.3)
GFR calc non Af Amer: >60 mL/min (ref >60)
Glucose, Bld: 109 mg/dL — ABNORMAL HIGH (ref 65–99)
POTASSIUM: 3.1 mmol/L — AB (ref 3.5–5.1)
SODIUM: 139 mmol/L (ref 135–145)

## 2015-01-24 LAB — CBC
HCT: 44 % (ref 36.0–46.0)
HEMOGLOBIN: 14.2 g/dL (ref 12.0–15.0)
MCH: 31.2 pg (ref 26.0–34.0)
MCHC: 32.3 g/dL (ref 30.0–36.0)
MCV: 96.7 fL (ref 78.0–100.0)
Platelets: 343 10*3/uL (ref 150–400)
RBC: 4.55 MIL/uL (ref 3.87–5.11)
RDW: 13.8 % (ref 11.5–15.5)
WBC: 5.6 10*3/uL (ref 4.0–10.5)

## 2015-01-24 LAB — HIV ANTIBODY (ROUTINE TESTING W REFLEX): HIV SCREEN 4TH GENERATION: NONREACTIVE

## 2015-01-24 MED ORDER — AMITRIPTYLINE HCL 25 MG PO TABS
25.0000 mg | ORAL_TABLET | Freq: Every day | ORAL | Status: DC
  Filled 2015-01-24: qty 2

## 2015-01-24 MED ORDER — ARIPIPRAZOLE 10 MG PO TABS
5.0000 mg | ORAL_TABLET | Freq: Every day | ORAL | Status: DC
  Administered 2015-01-25: 5 mg via ORAL
  Filled 2015-01-24: qty 1

## 2015-01-24 MED ORDER — ARIPIPRAZOLE 2 MG PO TABS
2.0000 mg | ORAL_TABLET | Freq: Every day | ORAL | Status: DC
  Filled 2015-01-24: qty 1

## 2015-01-24 MED ORDER — WHITE PETROLATUM GEL
Status: AC
  Administered 2015-01-24: 03:00:00
  Filled 2015-01-24: qty 1

## 2015-01-24 MED ORDER — SERTRALINE HCL 100 MG PO TABS
100.0000 mg | ORAL_TABLET | Freq: Every day | ORAL | Status: DC
Start: 2015-01-24 — End: 2015-01-25
  Administered 2015-01-24 – 2015-01-25 (×2): 100 mg via ORAL
  Filled 2015-01-24 (×2): qty 1

## 2015-01-24 NOTE — Care Management Note (Signed)
Case Management Note  Patient Details  Name: Patricia Mora MRN: [id number] Date of Birth: 12/10/1950  Subjective/Objective:                    Action/Plan: Patient was admitted with mental status change. Lives at home with spouse. Will follow for discharge needs.  Expected Discharge Date:                  Expected Discharge Plan:  Home/Self Care  In-House Referral:     Discharge planning Services     Post Acute Care Choice:    Choice offered to:     DME Arranged:    DME Agency:     HH Arranged:    HH Agency:     Status of Service:  In process, will continue to follow  Medicare Important Message Given:    Date Medicare IM Given:    Medicare IM give by:    Date Additional Medicare IM Given:    Additional Medicare Important Message give by:     If discussed at Long Length of Stay Meetings, dates discussed:    Additional CommentsAnda Kraft, RN 01/24/2015, 3:21 PM [phone]

## 2015-01-24 NOTE — Progress Notes (Signed)
Subjective:  Patient seen and examined at bedside. She had a psychotic episode overnight with suicidal ideations- Sitter was placed and she is on suicide rpecautions.  Morning, she continued to have paranoid delusions. We consulted psychiatry   Objective: Vital signs in last 24 hours: Filed Vitals:   01/23/15 1841 01/23/15 2229 01/24/15 0543 01/24/15 1401  BP: 170/86 167/95 172/105 153/96  Pulse: 89 86 93 103  Temp: 98 F (36.7 C) 98.2 F (36.8 C) 98.2 F (36.8 C) 98.2 F (36.8 C)  TempSrc: Oral Oral Oral Oral  Resp: 18 18 18 20   SpO2: 98% 99% 99% 100%   Weight change:  No intake or output data in the 24 hours ending 01/24/15 1535   General: Vital signs reviewed. Patient with paranoid delusions  Cardiovascular: regular rate, rhythm,   Pulmonary/Chest: Clear to auscultation bilaterally, no wheezes, rales, or rhonchi. Abdominal: Soft,, non-distended, BS + Extremities: No lower extremity edema bilaterally,    Lab Results: No results found for this or any previous visit (from the past 24 hour(s)).  Micro Results: No results found for this or any previous visit (from the past 240 hour(s)). Studies/Results: Ct Head Wo Contrast  01/23/2015  CLINICAL DATA:  65 year old female found unresponsive and combative. EXAM: CT HEAD WITHOUT CONTRAST TECHNIQUE: Contiguous axial images were obtained from the base of the skull through the vertex without intravenous contrast. COMPARISON:  None. FINDINGS: The ventricles are dilated and the sulci are prominent compatible with age-related atrophy. Periventricular and deep white matter hypodensities represent chronic microvascular ischemic changes. There is no intracranial hemorrhage. No mass effect or midline shift identified. The visualized paranasal sinuses are well aerated. There is opacification of multiple left mastoid air cells. The right mastoid air cells are clear. The calvarium is intact. IMPRESSION: No acute intracranial hemorrhage.  Age-related atrophy and chronic microvascular ischemic disease. If symptoms persist and there are no contraindications, MRI may provide better evaluation if clinically indicated. These results were called by telephone at the time of interpretation on 01/23/2015 at 5:42 am to Dr. 01/25/2015 , who verbally acknowledged these results. Electronically Signed   By: Rochele Raring M.D.   On: 01/23/2015 05:45   Mr Brain Wo Contrast  01/23/2015  CLINICAL DATA:  Altered mental status. EXAM: MRI HEAD WITHOUT CONTRAST TECHNIQUE: Multiplanar, multiecho pulse sequences of the brain and surrounding structures were obtained without intravenous contrast. COMPARISON:  CT head without contrast 01/23/2015 FINDINGS: The diffusion-weighted images demonstrate no evidence for acute or subacute infarction. Moderate atrophy and periventricular white matter disease is advanced for age. Moderate scattered subcortical white matter disease is present as well. Remote ischemic lesions are present in the thalami bilaterally. The brainstem and cerebellum are unremarkable. Internal auditory canals are within normal limits. Flow is present in the major intracranial arteries. The globes and orbits are intact. The paranasal sinuses are clear. Bilateral mastoid effusions are noted. No obstructing nasopharyngeal lesions are present. The skullbase is within normal limits. Degenerative changes are present in the upper cervical spine. Midline sagittal images are otherwise unremarkable. IMPRESSION: 1. No acute intracranial abnormality. 2. Moderate age advanced atrophy and white matter disease. This likely reflects the sequela of chronic microvascular ischemia. 3. Degenerate changes in the upper cervical spine. Electronically Signed   By: 01/25/2015 M.D.   On: 01/23/2015 11:27   Dg Chest Portable 1 View  01/23/2015  CLINICAL DATA:  65 year old female with altered mental status EXAM: PORTABLE CHEST 1 VIEW COMPARISON:  Radiograph dated  12/18/2014 FINDINGS:  Single-view of the chest demonstrates clear lungs. No pleural effusion or pneumothorax. The cardiac silhouette is within normal limits. There is osteopenia with degenerative changes of the spine. Bilateral chronic rotator cuff injury. IMPRESSION: No active disease. Electronically Signed   By: Elgie Collard M.D.   On: 01/23/2015 05:26   Medications: I have reviewed the patient's current medications. Scheduled Meds: . amitriptyline  25-50 mg Oral QHS  . [START ON 01/25/2015] ARIPiprazole  5 mg Oral Daily  . budesonide-formoterol  2 puff Inhalation Daily  . enoxaparin (LOVENOX) injection  40 mg Subcutaneous Q24H  . irbesartan  300 mg Oral Daily   And  . hydrochlorothiazide  12.5 mg Oral Daily  . hydroxychloroquine  200 mg Oral Daily  . levothyroxine  25 mcg Oral QAC breakfast  . [START ON 01/27/2015] methotrexate  20 mg Oral Q Mon  . montelukast  10 mg Oral QHS  . sertraline  100 mg Oral Daily   Continuous Infusions:  PRN Meds:.acetaminophen, albuterol Assessment/Plan: Principal Problem:   Altered mental status Active Problems:   Rheumatoid arthritis (HCC)   Psychiatric disorder   Chronic pain   Vomiting   Hypothyroidism  Altered mental Status and psychiatric delusions - Likely from overdose of multiple medication- Amitriptyline, sertraline, Aripiprazole, Baclofen, carisoprodol, gabapentin, tizanidine and zolpidem. Consider intentional overdose, as she has been in increasing amount of pain and more depressed due to recent loss of her brother. Of particular concern among others- TCA overdose-Amitrptyline-which can cause sedation, dilated pupils, and reduced bowel sounds, which patient presently has, but pt QRS is 88. Also monitor for toxicity from SSRIs- Sertraline. Initially Code stroke activated in the ED, Head CT and MRI not suggestive of cause. UDS- negative for opioids or any other drugs, likely pt ran out of pain meds- Oxycodone. Ethanol neg. Doubt seizures, as  post ictal phase appears too long, except if pt is having on-going seizure activity.  Overnight, pt had a psychotic episode, sitter was ordered, and suicide precautions were set in place. This morning, she continued to have paranoid delusions. B12, folate and TSH were within normal limits.   Psychiatry was consulted and said she does not meet inpatient criteria for psyc admission. They increased abilify to 50 mg at bedtime, and continued sertraline. We resumed her amitriptyline.   Pt refused EEG again this morning- neurology signed off.  -1:1 sitter and suicide precautions -resumed sertraline, amitriptyline, and abilify  Rheumatoid Arthritis- On methotrexate, Hydroxychloroquine and per husband low dose prednisone.  - Cont methotrexate and hydroxychloroquine -  Psychiatric disorder- with paranoia. Husband does not know patient diagnosis. Pt is on aripiprzole, Sertraline and Amitriptyline.  - resumed above 3 meds - Psych consult- does not meet inpatient criteria   Hypothyroidism- on synthroid- 25mg  daily. TSH WNL - cont synthroid   Dispo: Disposition is deferred at this time, awaiting improvement of current medical problems.  Anticipated discharge in approximately 1 day(s).   The patient does have a current PCP , MD) and does need an [facility] hospital follow-up appointment after discharge.  The patient does not have transportation limitations that hinder transportation to clinic appointments.  .Services Needed at time of discharge: Y = Yes, Blank = No PT:   OT:   RN:   Equipment:   Other:       [facility], MD 01/24/2015, 3:35 PM

## 2015-01-24 NOTE — Progress Notes (Signed)
Occupational Therapy Evaluation/Discharge Patient Details Name: Elianis Fischbach MRN: [id number] DOB: 05/09/1950 Today's Date: 01/24/2015    History of Present Illness Adm with AMS that improved with narcan (?opioid abuse); stated multiple times she would kill herself (suicide precautions)  PMHx- RA, back surgery, paranoia, opioid abuse   Clinical Impression   Patient evaluated by Occupational Therapy with no further acute OT needs identified. Husband present, but pt refused to allow him to respond to any questions regarding her care or PLOF. No OT follow up or DME recommendations at this time. OT signing off.    Follow Up Recommendations  No OT follow up    Equipment Recommendations  None recommended by OT    Recommendations for Other Services       Precautions / Restrictions Precautions Precautions: None Restrictions Weight Bearing Restrictions: No      Mobility Bed Mobility Overal bed mobility: Modified Independent                Transfers Overall transfer level: Modified independent Equipment used: Rolling walker (2 wheeled)                  Balance Overall balance assessment: No apparent balance deficits (not formally assessed)                                          ADL Overall ADL's : Needs assistance/impaired     Grooming: Wash/dry hands;Modified independent;Standing           Upper Body Dressing : Set up;Standing   Lower Body Dressing: Minimal assistance;With caregiver independent assisting;Sit to/from stand Lower Body Dressing Details (indicate cue type and reason): Refused to allow husband to respond or demonstrate how he assists Toilet Transfer: Modified Independent;Regular Toilet;RW   Toileting- Clothing Manipulation and Hygiene: Modified independent;Sit to/from stand   Tub/ Shower Transfer: Tub transfer;Ambulation;Modified independent;Shower seat;Rolling walker   Functional mobility during ADLs: Modified  independent;Rolling walker General ADL Comments: Mod I with all transfers just required incr time and effort and use of DME. Pt refused to allow husband to respond if she was moving around at her baseline.     Vision Vision Assessment?: No apparent visual deficits   Perception     Praxis      Pertinent Vitals/Pain Pain Assessment: Faces Faces Pain Scale: Hurts a little bit Pain Location: L knee Pain Descriptors / Indicators: Aching Pain Intervention(s): Limited activity within patient's tolerance;Monitored during session;Repositioned     Hand Dominance Right   Extremity/Trunk Assessment Upper Extremity Assessment Upper Extremity Assessment: Difficult to assess due to impaired cognition (RA changes in hand - refused to comply with strength or ROM)   Lower Extremity Assessment Lower Extremity Assessment: Difficult to assess due to impaired cognition   Cervical / Trunk Assessment Cervical / Trunk Assessment: Kyphotic   Communication Communication Communication: No difficulties   Cognition Arousal/Alertness: Awake/alert Behavior During Therapy: Restless;Anxious;Impulsive Overall Cognitive Status: Impaired/Different from baseline Area of Impairment: Safety/judgement;Awareness;Problem solving         Safety/Judgement: Decreased awareness of safety Awareness: Intellectual Problem Solving: Difficulty sequencing General Comments: Had difficulty sequencing through getting into tub   General Comments       Exercises       Shoulder Instructions      Home Living Family/patient expects to be discharged to:: Private residence Living Arrangements: Spouse/significant other Available Help at Discharge: Family Type of Home: House  Home Access: Level entry     Home Layout: 1/2 bath on main level;Bed/bath upstairs Alternate Level Stairs-Number of Steps: 13 Alternate Level Stairs-Rails:  (unsure which side) Bathroom Shower/Tub: Tub/shower unit;Curtain Shower/tub  characteristics: Engineer, building services: Standard Bathroom Accessibility: Yes How Accessible: Accessible via walker Home Equipment: Walker - 2 wheels;Walker - 4 wheels;Cane - single point;Shower seat;Hand held shower head   Additional Comments: Pt refused to let husband answer questions about their home or her PLOF      Prior Functioning/Environment Level of Independence: Independent with assistive device(s)        Comments: Pt reported that husband assists with socks and shoes. Pt refuses to allow husband to answer questions about her PLOF. Does not like to be asked personal information, some questions she did not answer    OT Diagnosis: Generalized weakness;Acute pain   OT Problem List: Decreased strength;Decreased range of motion;Decreased activity tolerance;Impaired balance (sitting and/or standing);Decreased coordination;Decreased cognition;Decreased safety awareness;Decreased knowledge of use of DME or AE;Pain   OT Treatment/Interventions:      OT Goals(Current goals can be found in the care plan section) Acute Rehab OT Goals Patient Stated Goal: to get out of here and go to a [employer] meeting in [place] tomorrow OT Goal Formulation: With patient Time For Goal Achievement: 02/07/15 Potential to Achieve Goals: Fair  OT Frequency:     Barriers to D/C:            Co-evaluation              End of Session Equipment Utilized During Treatment: Gait belt;Rolling walker Nurse Communication: Mobility status  Activity Tolerance: Patient tolerated treatment well Patient left: in bed;with call bell/phone within reach;with nursing/sitter in room;with family/visitor present   Time: 4166-0630 OT Time Calculation (min): 12 min Charges:  OT General Charges $OT Visit: 1 Procedure OT Evaluation $OT Eval Moderate Complexity: 1 Procedure G-Codes: OT G-codes **NOT FOR INPATIENT CLASS** Functional Assessment Tool Used: clinical judgement Functional Limitation: Self  care Self Care Current Status (Z6010): At least 1 percent but less than 20 percent impaired, limited or restricted Self Care Goal Status (X3235): At least 1 percent but less than 20 percent impaired, limited or restricted Self Care Discharge Status [phone]): At least 1 percent but less than 20 percent impaired, limited or restricted  Nils Pyle, OTR/L Pager: [phone] 01/24/2015, 3:24 PM

## 2015-01-24 NOTE — Evaluation (Signed)
Physical Therapy Evaluation and Discharge Patient Details Name: Patricia Mora MRN: [id number] DOB: 16-Feb-1950 Today's Date: 01/24/2015   History of Present Illness  Adm with AMS that improved with narcan (?opioid abuse); stated multiple times she would kill herself (suicide precautions)  PMHx- RA, back surgery, paranoia, opioid abuse  Clinical Impression  Patient evaluated by Physical Therapy with no further acute PT needs identified. Husband present and all in agreement. No DME needs as they have multiple items already.  PT is signing off. Thank you for this referral.     Follow Up Recommendations No PT follow up    Equipment Recommendations  None recommended by PT    Recommendations for Other Services       Precautions / Restrictions Precautions Precautions: None      Mobility  Bed Mobility Overal bed mobility: Modified Independent                Transfers Overall transfer level: Modified independent Equipment used: Rolling walker (2 wheeled)                Ambulation/Gait Ambulation/Gait assistance: Min guard;Supervision Ambulation Distance (Feet): 200 Feet Assistive device: Rolling walker (2 wheeled) Gait Pattern/deviations: Step-through pattern;Trunk flexed   Gait velocity interpretation: at or above normal speed for age/gender General Gait Details: pushes RW too far ahead, flexed at hips (cannot reverse with cues and assist)  Stairs Stairs:  (pt refused stating she will sleep downstairs)          Wheelchair Mobility    Modified Rankin (Stroke Patients Only)       Balance Overall balance assessment: No apparent balance deficits (not formally assessed) (reach to floor to pick up items; uses RW due to knee pain)                                           Pertinent Vitals/Pain Pain Assessment: Faces Faces Pain Scale: Hurts a little bit Pain Location: Lt knee Pain Descriptors / Indicators: Aching Pain Intervention(s):  Limited activity within patient's tolerance;Monitored during session;Repositioned    Home Living Family/patient expects to be discharged to:: Private residence Living Arrangements: Spouse/significant other Available Help at Discharge: Family Type of Home: House Home Access: Level entry     Home Layout: Two level;Able to live on main level with bedroom/bathroom (did not clarify bathroom downstairs) Home Equipment: Walker - 2 wheels;Walker - 4 wheels;Cane - single point;Shower seat Additional Comments: pt reported she has a wheelchair, husband reports its a 4wheeled RW with seat    Prior Function Level of Independence: Independent with assistive device(s)         Comments: does not like to be asked personal information, some questions she did not answer     Hand Dominance        Extremity/Trunk Assessment   Upper Extremity Assessment: Difficult to assess due to impaired cognition (RA changes in hands)           Lower Extremity Assessment: Difficult to assess due to impaired cognition (Lt knee swollen compared to RT)      Cervical / Trunk Assessment: Kyphotic  Communication   Communication: No difficulties  Cognition Arousal/Alertness: Awake/alert Behavior During Therapy: Restless;Anxious;Impulsive Overall Cognitive Status: Impaired/Different from baseline Area of Impairment: Problem solving             Problem Solving: Difficulty sequencing General Comments: could not figure out how  to carry her bible and use both hands on the RW; refused to allow anyone to hold the bible for her; ended up dropping bible to floor    General Comments General comments (skin integrity, edema, etc.): Husband present    Exercises        Assessment/Plan    PT Assessment Patent does not need any further PT services  PT Diagnosis Altered mental status   PT Problem List    PT Treatment Interventions     PT Goals (Current goals can be found in the Care Plan section) Acute  Rehab PT Goals Patient Stated Goal: none PT Goal Formulation: All assessment and education complete, DC therapy    Frequency     Barriers to discharge        Co-evaluation               End of Session   Activity Tolerance: Patient tolerated treatment well Patient left: in chair;with family/visitor present;with nursing/sitter in room Psychiatrist) Nurse Communication: Mobility status    Functional Assessment Tool Used: clinical judgement Functional Limitation: Mobility: Walking and moving around Mobility: Walking and Moving Around Current Status [phone]): At least 1 percent but less than 20 percent impaired, limited or restricted Mobility: Walking and Moving Around Goal Status [phone]): At least 1 percent but less than 20 percent impaired, limited or restricted Mobility: Walking and Moving Around Discharge Status [phone]): At least 1 percent but less than 20 percent impaired, limited or restricted    Time: 1414-1426 PT Time Calculation (min) (ACUTE ONLY): 12 min   Charges:   PT Evaluation $PT Eval Low Complexity: 1 Procedure     PT G Codes:   PT G-Codes **NOT FOR INPATIENT CLASS** Functional Assessment Tool Used: clinical judgement Functional Limitation: Mobility: Walking and moving around Mobility: Walking and Moving Around Current Status (B7048): At least 1 percent but less than 20 percent impaired, limited or restricted Mobility: Walking and Moving Around Goal Status [phone]): At least 1 percent but less than 20 percent impaired, limited or restricted Mobility: Walking and Moving Around Discharge Status [phone]): At least 1 percent but less than 20 percent impaired, limited or restricted    Cotey Rakes 01/24/2015, 2:41 PM Pager [phone]

## 2015-01-24 NOTE — Progress Notes (Signed)
Subjective: patient alert and oriented, follows all commands. Refused EEG twice.    Exam: Filed Vitals:   01/23/15 2229 01/24/15 0543  BP: 167/95 172/105  Pulse: 86 93  Temp: 98.2 F (36.8 C) 98.2 F (36.8 C)  Resp: 18 18       Gen: In bed, NAD MS: alert and oriented with normal thought process.  CN: Grossly intact Motor: MAEW Sensory: intact   Pertinent Labs: none  Felicie Morn PA-C [facility] [phone]  Impression: 65 YO female with AMS which improved after Narcan. Currently back to baseline. MRI shows no acute stroke. EEG unable to obtain due to patient refusal.    Recommendations: 1) no further recommendations. Neurology S/O    01/24/2015, 10:25 AM Patient seen and examined together with physician assistant and I concur with the assessment and plan.  Wyatt Portela, MD

## 2015-01-24 NOTE — Progress Notes (Signed)
Pt refused EEG °
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
## 2015-01-24 NOTE — Progress Notes (Signed)
Pt refused her medications at this time.   Sim Boast, RN

## 2015-01-25 ENCOUNTER — Other Ambulatory Visit: Payer: Self-pay | Admitting: Internal Medicine

## 2015-01-25 DIAGNOSIS — F22 Delusional disorders: Secondary | ICD-10-CM

## 2015-01-25 DIAGNOSIS — R410 Disorientation, unspecified: Secondary | ICD-10-CM | POA: Diagnosis not present

## 2015-01-25 DIAGNOSIS — R4182 Altered mental status, unspecified: Secondary | ICD-10-CM | POA: Diagnosis not present

## 2015-01-25 LAB — BASIC METABOLIC PANEL
ANION GAP: 14 (ref 5–15)
BUN: 5 mg/dL — ABNORMAL LOW (ref 6–20)
CALCIUM: 11.1 mg/dL — AB (ref 8.9–10.3)
CO2: 21 mmol/L — ABNORMAL LOW (ref 22–32)
Chloride: 102 mmol/L (ref 101–111)
Creatinine, Ser: 0.52 mg/dL (ref 0.44–1.00)
GLUCOSE: 154 mg/dL — AB (ref 65–99)
Potassium: 3.3 mmol/L — ABNORMAL LOW (ref 3.5–5.1)
SODIUM: 137 mmol/L (ref 135–145)

## 2015-01-25 LAB — CBC
HCT: 44.2 % (ref 36.0–46.0)
Hemoglobin: 14.4 g/dL (ref 12.0–15.0)
MCH: 31.8 pg (ref 26.0–34.0)
MCHC: 32.6 g/dL (ref 30.0–36.0)
MCV: 97.6 fL (ref 78.0–100.0)
PLATELETS: 324 10*3/uL (ref 150–400)
RBC: 4.53 MIL/uL (ref 3.87–5.11)
RDW: 13.8 % (ref 11.5–15.5)
WBC: 6.1 10*3/uL (ref 4.0–10.5)

## 2015-01-25 MED ORDER — POTASSIUM CHLORIDE CRYS ER 10 MEQ PO TBCR
EXTENDED_RELEASE_TABLET | ORAL | Status: AC
  Filled 2015-01-25: qty 4

## 2015-01-25 MED ORDER — POTASSIUM CHLORIDE CRYS ER 20 MEQ PO TBCR: 40.0000 meq | EXTENDED_RELEASE_TABLET | Freq: Once | ORAL | Status: DC

## 2015-01-25 MED ORDER — ARIPIPRAZOLE 5 MG PO TABS: 5.0000 mg | ORAL_TABLET | Freq: Every day | ORAL | Status: AC

## 2015-01-25 MED ORDER — POTASSIUM CHLORIDE CRYS ER 20 MEQ PO TBCR
40.0000 meq | EXTENDED_RELEASE_TABLET | Freq: Once | ORAL | Status: AC
  Administered 2015-01-25: 40 meq via ORAL
  Filled 2015-01-25: qty 2

## 2015-01-25 NOTE — Progress Notes (Addendum)
Subjective: Pt denies any complaints, husband is at bedside w/ her. They both deny having any questions or concerns at this time. States she is ready to go home.   Per sitter, no overnight events.   Objective: Vital signs in last 24 hours: Filed Vitals:   01/24/15 1401 01/24/15 1850 01/25/15 0104 01/25/15 0700  BP: 153/96 165/108 149/101 167/116  Pulse: 103 133 103 112  Temp: 98.2 F (36.8 C)  97.8 F (36.6 C) 98.6 F (37 C)  TempSrc: Oral   Oral  Resp: 20 18 18 18   SpO2: 100%  97% 100%   Weight change:   Intake/Output Summary (Last 24 hours) at 01/25/15 0914 Last data filed at 01/25/15 0856  Gross per 24 hour  Intake    375 ml  Output      1 ml  Net    374 ml     General: NAD, sitting up in bed.  HEENT: moist mucous membranes Cardiovascular: RRR, no r/m/g Pulmonary/Chest: Clear to auscultation bilaterally, no wheezes, rales, or rhonchi. Abdominal: Soft,, non-distended, BS + Extremities: ice pack on rt knee   Lab Results: Results for orders placed or performed during the hospital encounter of 01/23/15 (from the past 24 hour(s))  CBC     Status: None   Collection Time: 01/24/15  7:24 PM  Result Value Ref Range   WBC 5.6 4.0 - 10.5 K/uL   RBC 4.55 3.87 - 5.11 MIL/uL   Hemoglobin 14.2 12.0 - 15.0 g/dL   HCT 01/26/15 77.4 - 12.8 %   MCV 96.7 78.0 - 100.0 fL   MCH 31.2 26.0 - 34.0 pg   MCHC 32.3 30.0 - 36.0 g/dL   RDW 78.6 76.7 - 20.9 %   Platelets 343 150 - 400 K/uL  Basic metabolic panel     Status: Abnormal   Collection Time: 01/24/15  7:24 PM  Result Value Ref Range   Sodium 139 135 - 145 mmol/L   Potassium 3.1 (L) 3.5 - 5.1 mmol/L   Chloride 102 101 - 111 mmol/L   CO2 24 22 - 32 mmol/L   Glucose, Bld 109 (H) 65 - 99 mg/dL   BUN 6 6 - 20 mg/dL   Creatinine, Ser 01/26/15 0.44 - 1.00 mg/dL   Calcium 9.62 (H) 8.9 - 10.3 mg/dL   GFR calc non Af Amer >60 >60 mL/min   GFR calc Af Amer >60 >60 mL/min   Anion gap 13 5 - 15  CBC     Status: None   Collection Time:  01/25/15  2:30 AM  Result Value Ref Range   WBC 6.1 4.0 - 10.5 K/uL   RBC 4.53 3.87 - 5.11 MIL/uL   Hemoglobin 14.4 12.0 - 15.0 g/dL   HCT 01/27/15 62.9 - 47.6 %   MCV 97.6 78.0 - 100.0 fL   MCH 31.8 26.0 - 34.0 pg   MCHC 32.6 30.0 - 36.0 g/dL   RDW 54.6 50.3 - 54.6 %   Platelets 324 150 - 400 K/uL  Basic metabolic panel     Status: Abnormal   Collection Time: 01/25/15  2:30 AM  Result Value Ref Range   Sodium 137 135 - 145 mmol/L   Potassium 3.3 (L) 3.5 - 5.1 mmol/L   Chloride 102 101 - 111 mmol/L   CO2 21 (L) 22 - 32 mmol/L   Glucose, Bld 154 (H) 65 - 99 mg/dL   BUN 5 (L) 6 - 20 mg/dL   Creatinine, Ser 01/27/15 0.44 -  1.00 mg/dL   Calcium 29.9 (H) 8.9 - 10.3 mg/dL   GFR calc non Af Amer >60 >60 mL/min   GFR calc Af Amer >60 >60 mL/min   Anion gap 14 5 - 15    Studies/Results: Mr Brain Wo Contrast  01/23/2015  CLINICAL DATA:  Altered mental status. EXAM: MRI HEAD WITHOUT CONTRAST TECHNIQUE: Multiplanar, multiecho pulse sequences of the brain and surrounding structures were obtained without intravenous contrast. COMPARISON:  CT head without contrast 01/23/2015 FINDINGS: The diffusion-weighted images demonstrate no evidence for acute or subacute infarction. Moderate atrophy and periventricular white matter disease is advanced for age. Moderate scattered subcortical white matter disease is present as well. Remote ischemic lesions are present in the thalami bilaterally. The brainstem and cerebellum are unremarkable. Internal auditory canals are within normal limits. Flow is present in the major intracranial arteries. The globes and orbits are intact. The paranasal sinuses are clear. Bilateral mastoid effusions are noted. No obstructing nasopharyngeal lesions are present. The skullbase is within normal limits. Degenerative changes are present in the upper cervical spine. Midline sagittal images are otherwise unremarkable. IMPRESSION: 1. No acute intracranial abnormality. 2. Moderate age advanced  atrophy and white matter disease. This likely reflects the sequela of chronic microvascular ischemia. 3. Degenerate changes in the upper cervical spine. Electronically Signed   By: Marin Roberts M.D.   On: 01/23/2015 11:27   Medications: I have reviewed the patient's current medications. Scheduled Meds: . amitriptyline  25-50 mg Oral QHS  . ARIPiprazole  5 mg Oral Daily  . budesonide-formoterol  2 puff Inhalation Daily  . enoxaparin (LOVENOX) injection  40 mg Subcutaneous Q24H  . irbesartan  300 mg Oral Daily   And  . hydrochlorothiazide  12.5 mg Oral Daily  . hydroxychloroquine  200 mg Oral Daily  . levothyroxine  25 mcg Oral QAC breakfast  . [START ON 01/27/2015] methotrexate  20 mg Oral Q Mon  . montelukast  10 mg Oral QHS  . potassium chloride  40 mEq Oral Once  . sertraline  100 mg Oral Daily   Continuous Infusions:  PRN Meds:.acetaminophen, albuterol Assessment/Plan: Principal Problem:   Altered mental status Active Problems:   Rheumatoid arthritis (H[facility])   Psychiatric disorder   Chronic pain   Vomiting   Hypothyroidism  Altered mental Status and paranoid delusions-- pt is back to her baseline.  - Psych consulted, continued zolft 100mg  daily and increased abilify to 5mg  qhs - continue amitryptiline 25-50mg  qd  Rheumatoid Arthritis- On methotrexate, Hydroxychloroquine and per husband low dose prednisone.  - Cont methotrexate and hydroxychloroquine  Psychiatric disorder- with paranoia.  Pt is on aripiprzole, Sertraline and Amitriptyline.  - Psych consulted, continued zolft 100mg  daily and increased abilify to 5mg  qhs  Hypothyroidism- on synthroid- 25mg  daily. TSH WNL - cont synthroid  FEN-- K 3.3 today, KDur given at 4:05am today.   Dispo: d/c home today  The patient does have a current PCP , MD) and does need an [facility] hospital follow-up appointment after discharge.  The patient does not have transportation limitations that hinder  transportation to clinic appointments.  .Services Needed at time of discharge: Y = Yes, Blank = No PT:   OT:   RN:   Equipment:   Other:       , MD 01/25/2015, 9:14 AM

## 2015-01-25 NOTE — Discharge Instructions (Signed)
Start taking abilify 5mg  once a night. Please make sure to call your PCP and schedule a hospital follow up appointment.

## 2015-01-25 NOTE — Progress Notes (Addendum)
  Date: 01/25/2015  Patient name: Patricia Mora  Medical record number: [id number]  Date of birth: [birth year]   This patient has been seen and the plan of care was discussed with the house staff. Please see their note for complete details. I concur with their findings with the following additions/corrections:   Would also add to discharge instructions for the patient to stay adequately hydrated.  She has a mild hypercalcemia to 11. This is unlikely to explain her main presentation of paranoid delusions and AMS (<12), however, if her Ca were to rise further, it could complicate the situation.  HCTZ can be a culprit for this.  I do not see that she is on supplementation, but would advise her not to take calcium supplementation.  At PCP follow up, would consider changing antihypertensives to avoid issues with calcium.     D/C today with good PCP follow up and referral to see psychiatry if not already following with someone.    Inez Catalina, MD 01/25/2015, 10:45 AM

## 2015-01-27 NOTE — Discharge Summary (Signed)
Name: Patricia Mora MRN: [id number] DOB: Nov 30, 1950 65 y.o. PCP: Jolene Provost, MD  Date of Admission: 01/23/2015  4:25 AM Date of Discharge: 01/25/2015 Attending Physician: Dr. Heide Spark and Dr. Criselda Peaches  Discharge Diagnosis: 1. Altered mental status leading to paranoid delusions  Principal Problem:   Altered mental status Active Problems:   Rheumatoid arthritis (HCC)   Psychiatric disorder   Chronic pain   Vomiting   Hypothyroidism  Discharge Medications:   Medication List    TAKE these medications        amitriptyline 25 MG tablet  Commonly known as:  ELAVIL  Take 1-2 tablets by mouth at bedtime.     ARIPiprazole 5 MG tablet  Commonly known as:  ABILIFY  Take 1 tablet (5 mg total) by mouth daily.     aspirin-acetaminophen-caffeine 250-250-65 MG tablet  Commonly known as:  EXCEDRIN MIGRAINE  Take 1-2 tablets by mouth 2 (two) times daily as needed for migraine.     baclofen 10 MG tablet  Commonly known as:  LIORESAL  Take 1 tablet by mouth 3 (three) times daily.     capsaicin 0.025 % cream  Commonly known as:  ZOSTRIX  Apply 1 application topically 2 (two) times daily as needed (pain).     carisoprodol 350 MG tablet  Commonly known as:  SOMA  Take 1 tablet by mouth 3 (three) times daily as needed for muscle spasms.     folic acid 1 MG tablet  Commonly known as:  FOLVITE  Take 1 mg by mouth daily.     gabapentin 300 MG capsule  Commonly known as:  NEURONTIN  Take 300 mg by mouth 3 (three) times daily.     hydroxychloroquine 200 MG tablet  Commonly known as:  PLAQUENIL  Take 1 tablet by mouth daily.     levothyroxine 25 MCG tablet  Commonly known as:  SYNTHROID, LEVOTHROID  Take 1 tablet by mouth daily.     meloxicam 15 MG tablet  Commonly known as:  MOBIC  Take 1 tablet by mouth daily.     methotrexate 2.5 MG tablet  Commonly known as:  RHEUMATREX  Take 8 tablets by mouth once a week.     montelukast 10 MG tablet  Commonly known as:  SINGULAIR    Take 1 tablet by mouth at bedtime.     MOVANTIK 25 MG Tabs tablet  Generic drug:  naloxegol oxalate  Take 1 tablet by mouth daily.     oxyCODONE 15 MG immediate release tablet  Commonly known as:  ROXICODONE  Take 1 tablet by mouth daily.     Polyethylene Glycol 3350 Powd  Take 5 mLs by mouth daily.     PREMARIN vaginal cream  Generic drug:  conjugated estrogens  Place 1 application vaginally every Monday, Wednesday, and Friday.     PROAIR HFA 108 (90 Base) MCG/ACT inhaler  Generic drug:  albuterol  Inhale 1-2 puffs into the lungs every 4 (four) hours as needed for wheezing or shortness of breath.     sertraline 100 MG tablet  Commonly known as:  ZOLOFT  Take 1 tablet by mouth daily.     SYMBICORT 160-4.5 MCG/ACT inhaler  Generic drug:  budesonide-formoterol  Inhale 2 puffs into the lungs daily.     telmisartan-hydrochlorothiazide 80-12.5 MG tablet  Commonly known as:  MICARDIS HCT  Take 1 tablet by mouth daily.     tiZANidine 2 MG tablet  Commonly known as:  ZANAFLEX  Take 1  tablet by mouth 3 (three) times daily as needed for muscle spasms.     zolpidem 10 MG tablet  Commonly known as:  AMBIEN  Take 10 mg by mouth at bedtime.        Disposition and follow-up:   Patricia Mora was discharged from [facility] in Stable condition.  At the hospital follow up visit please address:  1.  Altered mental status:  She is on Amitriptyline, sertraline, Aripiprazole, Baclofen, carisoprodol, gabapentin, tizanidine and zolpidem Please consider tapering some of them off as there is a high chance of med interaction resulting in AMS.   Hypercalcemia: to 11. Pl repeat Calcium. Consider holding hctz and any calcium supplementation.  RA: Pl follow up for any flares  Hypothyroid: pl repeat TSh in few weeks   Psychiatry: pl follow up for paranoid delusions   2.  Labs / imaging needed at time of follow-up: BMET   3.  Pending labs/ test needing follow-up:    Follow-up Appointments:     Follow-up Information    Follow up with Jolene Provost, MD.   Specialty:  Family Medicine   Contact information:   [address] [place] [address] [phone]       Discharge Instructions:   Consultations: Treatment Team:  Leata Mouse, MD  Procedures Performed:  Ct Head Wo Contrast  01/23/2015  CLINICAL DATA:  65 year old female found unresponsive and combative. EXAM: CT HEAD WITHOUT CONTRAST TECHNIQUE: Contiguous axial images were obtained from the base of the skull through the vertex without intravenous contrast. COMPARISON:  None. FINDINGS: The ventricles are dilated and the sulci are prominent compatible with age-related atrophy. Periventricular and deep white matter hypodensities represent chronic microvascular ischemic changes. There is no intracranial hemorrhage. No mass effect or midline shift identified. The visualized paranasal sinuses are well aerated. There is opacification of multiple left mastoid air cells. The right mastoid air cells are clear. The calvarium is intact. IMPRESSION: No acute intracranial hemorrhage. Age-related atrophy and chronic microvascular ischemic disease. If symptoms persist and there are no contraindications, MRI may provide better evaluation if clinically indicated. These results were called by telephone at the time of interpretation on 01/23/2015 at 5:42 am to Dr. Rochele Raring , who verbally acknowledged these results. Electronically Signed   By: Elgie Collard M.D.   On: 01/23/2015 05:45   Mr Brain Wo Contrast  01/23/2015  CLINICAL DATA:  Altered mental status. EXAM: MRI HEAD WITHOUT CONTRAST TECHNIQUE: Multiplanar, multiecho pulse sequences of the brain and surrounding structures were obtained without intravenous contrast. COMPARISON:  CT head without contrast 01/23/2015 FINDINGS: The diffusion-weighted images demonstrate no evidence for acute or subacute infarction. Moderate atrophy and  periventricular white matter disease is advanced for age. Moderate scattered subcortical white matter disease is present as well. Remote ischemic lesions are present in the thalami bilaterally. The brainstem and cerebellum are unremarkable. Internal auditory canals are within normal limits. Flow is present in the major intracranial arteries. The globes and orbits are intact. The paranasal sinuses are clear. Bilateral mastoid effusions are noted. No obstructing nasopharyngeal lesions are present. The skullbase is within normal limits. Degenerative changes are present in the upper cervical spine. Midline sagittal images are otherwise unremarkable. IMPRESSION: 1. No acute intracranial abnormality. 2. Moderate age advanced atrophy and white matter disease. This likely reflects the sequela of chronic microvascular ischemia. 3. Degenerate changes in the upper cervical spine. Electronically Signed   By: Marin Roberts M.D.   On: 01/23/2015 11:27  Dg Chest Portable 1 View  01/23/2015  CLINICAL DATA:  65 year old female with altered mental status EXAM: PORTABLE CHEST 1 VIEW COMPARISON:  Radiograph dated 12/18/2014 FINDINGS: Single-view of the chest demonstrates clear lungs. No pleural effusion or pneumothorax. The cardiac silhouette is within normal limits. There is osteopenia with degenerative changes of the spine. Bilateral chronic rotator cuff injury. IMPRESSION: No active disease. Electronically Signed   By: Elgie Collard M.D.   On: 01/23/2015 05:26    2D Echo:   Cardiac Cath:   Admission HPI:  Pt si a 53 Y O F with PMH- Rheumatoid arthritis, Asthma, opioid dependence, thyroid gland dx- hypothyroidism, iron deficiency anemia, psychiatric dsd, chronic back pain. Presented today via EMS with altered mental status. History was gotten from husband as pt was drowsy, but more awake then when she came in. Both pt and her husband work for [employer]- night shift.  Pt was at work, dropped off by her husband at  7pm, was normal, per husband, but was found by her co-workers, wondering around, was not making any sense when she was questioned, husband talked to patient at about 11pm, she was Normal, but he came in around 3am, when co-workers called him that wife was acting abnormally. Pt says wife was just starring with her mouth open. Nobody noticed any jerking of her extremities. Pt vomited at work and also en route via EMS .No fever noted, no new complaints to husband recently- No cough, SOB, diarrhea.  Husband does note that pt has been in increased amount of pain from her back and knee, and that she has been taking an increased amount of pain meds. She has had back surgery- ~1 and a half year ago, and has a pain contract, but she runs out of her meds 1-2 weeks before she is due for a refill. She is also on a number of sedating drugs, muscle relaxants, but husband does not know how she takes it, or if she took overdose. Pt lost her brother 2 days ago to alzheimers dx, and she has been depressed. Pt has also been very paranoid, accusing the husband of many things like, trying to poison her, so she has not been eating. Per husband pt has lost ~30lbs in the past 4 months, from not eating.  Pt had a similar episode- October of this year- where she took opioids and muscle relaxants , and had a psychotic reaction, for which she was admitted at [facility] from the 10/24 to 10/27 2016.  Pt quit smoking cigs- 62yrs ago, very rarely takes alcohol, doesn't use drugs.    Hospital Course by problem list: Principal Problem:   Altered mental status Active Problems:   Rheumatoid arthritis (HCC)   Psychiatric disorder   Chronic pain   Vomiting   Hypothyroidism   Altered mental Status and psychiatric delusions - Likely from overdose of multiple medication- Amitriptyline, sertraline, Aripiprazole, Baclofen, carisoprodol, gabapentin, tizanidine and zolpidem. Consider intentional overdose, as she has been in  increasing amount of pain and more depressed due to recent loss of her brother. Of particular concern among others- TCA overdose-Amitrptyline-which can cause sedation, dilated pupils, and reduced bowel sounds, which patient presently has, but pt QRS is 88. Also monitor for toxicity from SSRIs- Sertraline. Initially Code stroke activated in the ED, Head CT and MRI not suggestive of cause. UDS- negative for opioids or any other drugs, likely pt ran out of pain meds- Oxycodone. Ethanol neg. Doubt seizures, as post ictal phase appears too  long, except if pt is having on-going seizure activity. Overnight, pt had a psychotic episode, sitter was ordered, and suicide precautions were set in place. This morning, she continued to have paranoid delusions. B12, folate and TSH were within normal limits.  Psychiatry was consulted and said she does not meet inpatient criteria for psyc admission. They increased abilify to 50 mg at bedtime, and continued sertraline. We resumed her amitriptyline. Pt refused EEG again this morning- neurology signed off. So we resumed her sertraline, abilify and amitriptyline.  On the day of discharge, pt mental status was at baseline.    Rheumatoid Arthritis- On methotrexate, Hydroxychloroquine and per husband low dose prednisone. We continued  methotrexate and hydroxychloroquine/  Psychiatric disorder- with paranoia. Husband does not know patient diagnosis. Pt is on aripiprzole, Sertraline and Amitriptyline and we resumed all three. Psych consulted and she does not meet inpatient criteria   Hypothyroidism- on synthroid- 25mg  daily. TSH WNL. continued synthroid   Pl note that I was off on 1/21, when the pt was discharged and the hospital course is per the progress notes. Thanks.  Discharge Vitals:   BP 141/84 mmHg  Pulse 107  Temp(Src) 98.5 F (36.9 C) (Oral)  Resp 18  SpO2 100%  Discharge Labs:  No results found for this or any previous visit (from the past 24  hour(s)).  Signed: 2/21, MD 01/27/2015, 2:23 PM    Services Ordered on Discharge:  Equipment Ordered on Discharge:

## 2016-10-27 ENCOUNTER — Other Ambulatory Visit: Payer: Self-pay | Admitting: Orthopaedic Surgery

## 2016-10-27 DIAGNOSIS — M5136 Other intervertebral disc degeneration, lumbar region: Secondary | ICD-10-CM

## 2016-11-06 ENCOUNTER — Ambulatory Visit
Admission: RE | Admit: 2016-11-06 | Discharge: 2016-11-06 | Disposition: A | Discharge status: Home / Self Care | Payer: Medicare Other | Source: Ambulatory Visit | Attending: Orthopaedic Surgery | Admitting: Orthopaedic Surgery

## 2016-11-06 DIAGNOSIS — M5136 Other intervertebral disc degeneration, lumbar region: Secondary | ICD-10-CM

## 2016-11-09 ENCOUNTER — Other Ambulatory Visit: Payer: Self-pay

## 2017-05-12 DIAGNOSIS — M2550 Pain in unspecified joint: Secondary | ICD-10-CM | POA: Insufficient documentation

## 2019-06-01 DIAGNOSIS — F4323 Adjustment disorder with mixed anxiety and depressed mood: Secondary | ICD-10-CM | POA: Insufficient documentation

## 2019-06-17 ENCOUNTER — Emergency Department (HOSPITAL_COMMUNITY): Payer: Federal, State, Local not specified - PPO

## 2019-06-17 ENCOUNTER — Encounter (HOSPITAL_COMMUNITY): Payer: Self-pay | Admitting: *Deleted

## 2019-06-17 ENCOUNTER — Other Ambulatory Visit: Payer: Self-pay

## 2019-06-17 ENCOUNTER — Emergency Department (HOSPITAL_COMMUNITY)
Admission: EM | Admit: 2019-06-17 | Discharge: 2019-06-17 | Disposition: A | Discharge status: Home / Self Care | Payer: Federal, State, Local not specified - PPO | Attending: Emergency Medicine | Admitting: Emergency Medicine

## 2019-06-17 DIAGNOSIS — M7918 Myalgia, other site: Secondary | ICD-10-CM | POA: Diagnosis not present

## 2019-06-17 DIAGNOSIS — S022XXA Fracture of nasal bones, initial encounter for closed fracture: Secondary | ICD-10-CM | POA: Insufficient documentation

## 2019-06-17 DIAGNOSIS — M79632 Pain in left forearm: Secondary | ICD-10-CM | POA: Diagnosis not present

## 2019-06-17 DIAGNOSIS — M542 Cervicalgia: Secondary | ICD-10-CM | POA: Diagnosis not present

## 2019-06-17 DIAGNOSIS — Y999 Unspecified external cause status: Secondary | ICD-10-CM | POA: Insufficient documentation

## 2019-06-17 DIAGNOSIS — M069 Rheumatoid arthritis, unspecified: Secondary | ICD-10-CM | POA: Insufficient documentation

## 2019-06-17 DIAGNOSIS — M79642 Pain in left hand: Secondary | ICD-10-CM | POA: Insufficient documentation

## 2019-06-17 DIAGNOSIS — Y9389 Activity, other specified: Secondary | ICD-10-CM | POA: Diagnosis not present

## 2019-06-17 DIAGNOSIS — Z79899 Other long term (current) drug therapy: Secondary | ICD-10-CM | POA: Diagnosis not present

## 2019-06-17 DIAGNOSIS — E039 Hypothyroidism, unspecified: Secondary | ICD-10-CM | POA: Diagnosis not present

## 2019-06-17 DIAGNOSIS — Y92099 Unspecified place in other non-institutional residence as the place of occurrence of the external cause: Secondary | ICD-10-CM | POA: Insufficient documentation

## 2019-06-17 DIAGNOSIS — R350 Frequency of micturition: Secondary | ICD-10-CM | POA: Insufficient documentation

## 2019-06-17 DIAGNOSIS — W108XXA Fall (on) (from) other stairs and steps, initial encounter: Secondary | ICD-10-CM | POA: Insufficient documentation

## 2019-06-17 DIAGNOSIS — S0990XA Unspecified injury of head, initial encounter: Secondary | ICD-10-CM | POA: Diagnosis present

## 2019-06-17 DIAGNOSIS — S199XXA Unspecified injury of neck, initial encounter: Secondary | ICD-10-CM | POA: Insufficient documentation

## 2019-06-17 DIAGNOSIS — W19XXXA Unspecified fall, initial encounter: Secondary | ICD-10-CM

## 2019-06-17 HISTORY — DX: Iron deficiency anemia, unspecified: D50.9

## 2019-06-17 HISTORY — DX: Hypothyroidism, unspecified: E03.9

## 2019-06-17 HISTORY — DX: Generalized anxiety disorder: F41.1

## 2019-06-17 HISTORY — DX: Gastro-esophageal reflux disease without esophagitis: K21.9

## 2019-06-17 LAB — CBC WITH DIFFERENTIAL/PLATELET
Abs Immature Granulocytes: 0.04 10*3/uL (ref 0.00–0.07)
Basophils Absolute: 0 10*3/uL (ref 0.0–0.1)
Basophils Relative: 0 %
Eosinophils Absolute: 0 10*3/uL (ref 0.0–0.5)
Eosinophils Relative: 0 %
HCT: 37.7 % (ref 36.0–46.0)
Hemoglobin: 11.7 g/dL — ABNORMAL LOW (ref 12.0–15.0)
Immature Granulocytes: 1 %
Lymphocytes Relative: 13 %
Lymphs Abs: 0.9 10*3/uL (ref 0.7–4.0)
MCH: 29.5 pg (ref 26.0–34.0)
MCHC: 31 g/dL (ref 30.0–36.0)
MCV: 95.2 fL (ref 80.0–100.0)
Monocytes Absolute: 0.4 10*3/uL (ref 0.1–1.0)
Monocytes Relative: 5 %
Neutro Abs: 5.5 10*3/uL (ref 1.7–7.7)
Neutrophils Relative %: 81 %
Platelets: 268 10*3/uL (ref 150–400)
RBC: 3.96 MIL/uL (ref 3.87–5.11)
RDW: 14.7 % (ref 11.5–15.5)
WBC: 6.8 10*3/uL (ref 4.0–10.5)
nRBC: 0 % (ref 0.0–0.2)

## 2019-06-17 LAB — URINALYSIS, ROUTINE W REFLEX MICROSCOPIC
Bilirubin Urine: NEGATIVE
Glucose, UA: NEGATIVE mg/dL
Ketones, ur: NEGATIVE mg/dL
Nitrite: NEGATIVE
Protein, ur: 30 mg/dL — AB
Specific Gravity, Urine: 1.018 (ref 1.005–1.030)
Squamous Epithelial / LPF: >50 — ABNORMAL HIGH (ref 0–5)
WBC, UA: >50 WBC/hpf — ABNORMAL HIGH (ref 0–5)
pH: 7 (ref 5.0–8.0)

## 2019-06-17 LAB — COMPREHENSIVE METABOLIC PANEL
ALT: 30 U/L (ref 0–44)
AST: 56 U/L — ABNORMAL HIGH (ref 15–41)
Albumin: 3.5 g/dL (ref 3.5–5.0)
Alkaline Phosphatase: 102 U/L (ref 38–126)
Anion gap: 11 (ref 5–15)
BUN: 15 mg/dL (ref 8–23)
CO2: 22 mmol/L (ref 22–32)
Calcium: 9.3 mg/dL (ref 8.9–10.3)
Chloride: 106 mmol/L (ref 98–111)
Creatinine, Ser: 0.53 mg/dL (ref 0.44–1.00)
GFR calc Af Amer: >60 mL/min (ref >60)
GFR calc non Af Amer: >60 mL/min (ref >60)
Glucose, Bld: 111 mg/dL — ABNORMAL HIGH (ref 70–99)
Potassium: 3.6 mmol/L (ref 3.5–5.1)
Sodium: 139 mmol/L (ref 135–145)
Total Bilirubin: 0.7 mg/dL (ref 0.3–1.2)
Total Protein: 6 g/dL — ABNORMAL LOW (ref 6.5–8.1)

## 2019-06-17 MED ORDER — HYDROMORPHONE HCL 1 MG/ML IJ SOLN: 1.0000 mg | Freq: Once | INTRAMUSCULAR | Status: AC

## 2019-06-17 MED ORDER — HYDROMORPHONE HCL 1 MG/ML IJ SOLN
INTRAMUSCULAR | Status: AC
  Administered 2019-06-17: 1 mg via INTRAVENOUS
  Filled 2019-06-17: qty 1

## 2019-06-17 MED ORDER — MORPHINE SULFATE (PF) 2 MG/ML IV SOLN
INTRAVENOUS | Status: AC
  Administered 2019-06-17: 2 mg via INTRAVENOUS
  Filled 2019-06-17: qty 1

## 2019-06-17 MED ORDER — MORPHINE SULFATE (PF) 2 MG/ML IV SOLN: 2.0000 mg | Freq: Once | INTRAVENOUS | Status: AC

## 2019-06-17 NOTE — Discharge Instructions (Addendum)
Your work-up today was overall reassuring.  Your scans did not show any broken bones or fractures other than your nasal bone fracture which is often self healing. Please make an appointment with the Ear, nose and throat doctor provided on your discharge paperwork. Continue to take your prescribed pain medications.  We sent your urine off for culture to test for UTI.  If this is positive, you will receive a call for treatment.

## 2019-06-17 NOTE — ED Notes (Addendum)
Urine Culture sent as add-on

## 2019-06-17 NOTE — ED Triage Notes (Signed)
Pt arrives via [facility] from home. Husband came inside and saw her on the floor, she had walking up the stairs, missed the steps, hit face first on stairs, but does not remember, left nare bleeding, left wrist deformity. Generalized pain.neck pain- collar. 250 mcg Fentanyl given en route to the right AC. 200/120, resp 40.

## 2019-06-17 NOTE — ED Provider Notes (Addendum)
[facility] EMERGENCY DEPARTMENT Provider Note   CSN: [id number] Arrival date & time: 06/17/19  2014     History No chief complaint on file.   Patricia Mora is a 69 y.o. female.  HPI 69 year old female with a history of rheumatoid arthritis, asthma, GERD, hypothyroid and iron deficiency anemia presents to the ED via EMS from home.  Patient was found by her husband on the stairs, patient reports that she was walking up the stairs and missed a step and fell face forward.  She was noted to have a left arm deformity and bleeding out of her left nare.  She states she hit her head but does not recall passing out.  She reports pain to her left wrist and generalized body aches.  She states that she has chronic lower left leg numbness which is unchanged, but denies any numbness or tingling to her lower or upper extremities.  She has no chest pain, shortness of breath, dizziness, vision changes, nausea, vomiting.  She endorses some neck pain, but no low back pain.  She is not on anticoagulation.  Past Medical History:  Diagnosis Date  . Asthma   . Generalized anxiety disorder   . GERD (gastroesophageal reflux disease)   . Hypothyroid   . Iron deficiency anemia   . RA (rheumatoid arthritis) [facility])     Patient Active Problem List   Diagnosis Date Noted  . Adjustment disorder with mixed anxiety and depressed mood 06/01/2019  . Arthralgia 05/12/2017  . Altered mental status 01/23/2015  . Rheumatoid arthritis (HCC) 01/23/2015  . Psychiatric disorder 01/23/2015  . Chronic pain 01/23/2015  . Vomiting 01/23/2015  . Hypothyroidism 01/23/2015  . Acute cystitis without hematuria 10/30/2014  . Dysphagia 08/21/2014  . Gastroesophageal reflux disease without esophagitis 08/21/2014  . Asthma 10/31/2013  . Benign essential hypertension 10/31/2013  . Bulging lumbar disc 10/31/2013  . Insomnia 10/31/2013  . Osteoarthritis 10/31/2013    Past Surgical History:  Procedure Laterality  Date  . BACK SURGERY  2015   Lumbar fusion     OB History   No obstetric history on file.     No family history on file.  Social History   Tobacco Use  . Smoking status: Unknown If Ever Smoked  Substance Use Topics  . Alcohol use: No  . Drug use: No    Home Medications Prior to Admission medications   Medication Sig Start Date End Date Taking? Authorizing Provider  albuterol (PROAIR HFA) 108 (90 Base) MCG/ACT inhaler Inhale 1-2 puffs into the lungs every 4 (four) hours as needed for wheezing or shortness of breath.     [provider]  amitriptyline (ELAVIL) 25 MG tablet Take 1-2 tablets by mouth at bedtime. 10/16/14   [provider]  ARIPiprazole (ABILIFY) 5 MG tablet Take 1 tablet (5 mg total) by mouth daily. 01/25/15   Denton Brick, MD  aspirin-acetaminophen-caffeine (EXCEDRIN MIGRAINE) [phone] MG tablet Take 1-2 tablets by mouth 2 (two) times daily as needed for migraine.     [provider]  baclofen (LIORESAL) 10 MG tablet Take 1 tablet by mouth 3 (three) times daily. 01/20/15   [provider]  budesonide-formoterol (SYMBICORT) 160-4.5 MCG/ACT inhaler Inhale 2 puffs into the lungs daily.    [provider]  capsaicin (ZOSTRIX) 0.025 % cream Apply 1 application topically 2 (two) times daily as needed (pain).  01/09/15   [provider]  carisoprodol (SOMA) 350 MG tablet Take 1 tablet by mouth 3 (  three) times daily as needed for muscle spasms.  01/02/15   [provider]  conjugated estrogens (PREMARIN) vaginal cream Place 1 application vaginally every Monday, Wednesday, and Friday. 09/30/14   [provider]  folic acid (FOLVITE) 1 MG tablet Take 1 mg by mouth daily.    [provider]  gabapentin (NEURONTIN) 300 MG capsule Take 300 mg by mouth 3 (three) times daily.    [provider]  hydroxychloroquine (PLAQUENIL) 200 MG tablet Take 1 tablet by mouth daily. 09/29/14   [provider]  levothyroxine (SYNTHROID, LEVOTHROID) 25 MCG tablet Take 1 tablet by mouth daily. 11/08/14   [provider]  methotrexate (RHEUMATREX) 2.5 MG tablet Take 8 tablets by mouth once a week. 12/24/14   [provider]  montelukast (SINGULAIR) 10 MG tablet Take 1 tablet by mouth at bedtime. 07/05/14   [provider]  naloxegol oxalate (MOVANTIK) 25 MG TABS tablet Take 1 tablet by mouth daily. 11/05/14   [provider]  oxyCODONE (ROXICODONE) 15 MG immediate release tablet Take 1 tablet by mouth daily. 01/02/15   [provider]  Polyethylene Glycol 3350 POWD Take 5 mLs by mouth daily.    [provider]  sertraline (ZOLOFT) 100 MG tablet Take 1 tablet by mouth daily. 12/08/14   [provider]  telmisartan-hydrochlorothiazide (MICARDIS HCT) 80-12.5 MG tablet Take 1 tablet by mouth daily. 12/25/14   [provider]  tiZANidine (ZANAFLEX) 2 MG tablet Take 1 tablet by mouth 3 (three) times daily as needed for muscle spasms.  12/10/14   [provider]  zolpidem (AMBIEN) 10 MG tablet Take 10 mg by mouth at bedtime.    [provider]    Allergies    Other  Review of Systems   Review of Systems  Constitutional: Negative for chills and fever.  HENT: Negative for ear pain and sore throat.   Eyes: Negative for pain and visual disturbance.  Respiratory: Negative for cough and shortness of breath.   Cardiovascular: Negative for chest pain and palpitations.  Gastrointestinal: Negative for abdominal pain, nausea and vomiting.  Genitourinary: Negative for dysuria and hematuria.  Musculoskeletal: Positive for arthralgias, joint swelling, neck pain and neck stiffness. Negative for back pain.  Skin: Negative for color change and rash.  Neurological: Negative for dizziness, seizures, syncope, weakness, numbness and headaches.  Psychiatric/Behavioral: Negative for confusion.  All other systems reviewed and are  negative.   Physical Exam Updated Vital Signs BP (!) 170/97   Pulse 75   Temp 98.1 F (36.7 C) (Temporal)   Resp 19   Ht 4\' 11"  (1.499 m)   Wt 55.8 kg   SpO2 99%   BMI 24.84 kg/m   Physical Exam Vitals and nursing note reviewed.  Constitutional:      General: She is in acute distress.     Appearance: Normal appearance. She is well-developed. She is not ill-appearing.  HENT:     Head: Normocephalic and atraumatic.     Comments: No of hemotympanum, raccoon eyes, battle sign.  No mastoid tenderness.  No malocclusion.  No evidence of lacerations, cranial deformities.       Right Ear: Tympanic membrane normal.     Left Ear: Tympanic membrane normal.     Nose: Nose normal.     Mouth/Throat:     Mouth: Mucous membranes are moist.     Pharynx: Oropharynx is clear.  Eyes:     Conjunctiva/sclera: Conjunctivae normal.  Cardiovascular:  Rate and Rhythm: Normal rate and regular rhythm.     Heart sounds: No murmur heard.   Pulmonary:     Effort: Pulmonary effort is normal. No respiratory distress.     Breath sounds: Normal breath sounds.  Abdominal:     Palpations: Abdomen is soft.     Tenderness: There is no abdominal tenderness.  Musculoskeletal:        General: Normal range of motion.     Cervical back: Neck supple.     Comments: Left hand/wrist deformity, limited range of motion secondary to pain.  2+ radial pulses bilaterally.  Chronic, unchanged numbness in left thigh, 5/5 strength in lower extremities bilaterally.  2+ DP pulses.  No hip tenderness, midline tenderness to T, L-spine.  Midline tenderness to C-spine.  Patient in c-collar.  She is moving her right arm with no difficulty.  No chest wall tenderness.  Skin:    General: Skin is warm and dry.     Coloration: Skin is not pale.     Findings: No bruising.  Neurological:     Mental Status: She is alert.     ED Results / Procedures / Treatments   Labs (all labs ordered are listed, but only abnormal results are  displayed) Labs Reviewed  CBC WITH DIFFERENTIAL/PLATELET - Abnormal; Notable for the following components:      Result Value   Hemoglobin 11.7 (*)    All other components within normal limits  COMPREHENSIVE METABOLIC PANEL - Abnormal; Notable for the following components:   Glucose, Bld 111 (*)    Total Protein 6.0 (*)    AST 56 (*)    All other components within normal limits  URINALYSIS, ROUTINE W REFLEX MICROSCOPIC - Abnormal; Notable for the following components:   APPearance CLOUDY (*)    Hgb urine dipstick MODERATE (*)    Protein, ur 30 (*)    Leukocytes,Ua LARGE (*)    WBC, UA >50 (*)    Bacteria, UA FEW (*)    Squamous Epithelial / LPF >50 (*)    Non Squamous Epithelial 6-10 (*)    All other components within normal limits  URINE CULTURE    EKG EKG Interpretation  Date/Time:  Sunday June 17 2019 20:27:19 EDT Ventricular Rate:  73 PR Interval:    QRS Duration: 96 QT Interval:  459 QTC Calculation: 506 R Axis:   50 Text Interpretation: Sinus rhythm Left ventricular hypertrophy Prolonged QT interval Baseline wander in lead(s) V5 Confirmed by Kennis Carina [phone]) on 06/17/2019 10:36:18 PM   Radiology DG Forearm Left  Result Date: 06/17/2019 CLINICAL DATA:  Recent fall with forearm pain, initial encounter EXAM: LEFT FOREARM - 2 VIEW COMPARISON:  None. FINDINGS: Degenerative changes are noted about the wrist joint. No acute fracture or dislocation is seen. No soft tissue abnormality is noted. IMPRESSION: No acute abnormality noted. Electronically Signed   By: Alcide Clever M.D.   On: 06/17/2019 21:09   CT Head Wo Contrast  Result Date: 06/17/2019 CLINICAL DATA:  Fall today with diffuse pain. EXAM: CT HEAD WITHOUT CONTRAST TECHNIQUE: Contiguous axial images were obtained from the base of the skull through the vertex without intravenous contrast. COMPARISON:  Head and face CT 03/22/2019 FINDINGS: Brain: No intracranial hemorrhage, mass effect, or midline shift. Stable  degree of atrophy, mildly advanced for age. Stable degree of chronic small vessel ischemia. No hydrocephalus. The basilar cisterns are patent. No evidence of territorial infarct or acute ischemia. No extra-axial or intracranial fluid collection. Vascular:  No hyperdense vessel or unexpected calcification. Skull: No fracture or focal lesion. Sinuses/Orbits: Assessed on concurrent face CT, reported separately. Other: None. IMPRESSION: 1. No acute intracranial abnormality. No skull fracture. 2. Stable atrophy and chronic small vessel ischemia. Electronically Signed   By: Narda Rutherford M.D.   On: 06/17/2019 21:02   CT Cervical Spine Wo Contrast  Result Date: 06/17/2019 CLINICAL DATA:  Fall today with diffuse pain. EXAM: CT CERVICAL SPINE WITHOUT CONTRAST TECHNIQUE: Multidetector CT imaging of the cervical spine was performed without intravenous contrast. Multiplanar CT image reconstructions were also generated. COMPARISON:  No dedicated cervical spine imaging. Included portions from facial CT 03/22/2019 FINDINGS: Alignment: Multifocal listhesis throughout near every vertebral level in the cervical spine, all grade 1. This is stable in the upper spine or visualized on prior face CT. There are no jumped or perched facets. Skull base and vertebrae: No evidence of acute fracture. Dens and skull base are intact. Modic endplate changes at C3-C4 and C4-C5. Soft tissues and spinal canal: No prevertebral fluid or swelling. No visible canal hematoma. Disc levels: Advanced degenerative disc disease C3-C4 C4-C5 and C5-C6, with lesser degenerative disc disease at additional levels. There is multilevel facet hypertrophy. Multilevel neural foraminal stenosis. Upper chest: No acute findings. Other: None. IMPRESSION: 1. No acute fracture or subluxation of the cervical spine. 2. Advanced multilevel degenerative disc disease and facet hypertrophy throughout the cervical spine. Multifocal listhesis throughout near every vertebral  level in the cervical spine. Electronically Signed   By: Narda Rutherford M.D.   On: 06/17/2019 21:08   DG Chest Portable 1 View  Result Date: 06/17/2019 CLINICAL DATA:  Recent fall with chest pain, initial encounter EXAM: PORTABLE CHEST 1 VIEW COMPARISON:  05/15/2019 FINDINGS: Cardiac shadow is at the upper limits of normal in size. Tortuous thoracic aorta is noted. Lungs are well aerated bilaterally. No focal infiltrate or effusion is seen. Chronic changes about shoulder joints are noted. IMPRESSION: No acute abnormality noted. Electronically Signed   By: Alcide Clever M.D.   On: 06/17/2019 21:11   DG Hand Complete Left  Result Date: 06/17/2019 CLINICAL DATA:  Recent fall with hand pain, initial encounter EXAM: LEFT HAND - COMPLETE 3+ VIEW COMPARISON:  None. FINDINGS: Degenerative changes are noted in the interphalangeal joints healed fracture at the base of the fifth metacarpal is noted. Degenerative changes of the radiocarpal joint are seen. No acute fracture or dislocation is noted. No soft tissue abnormality is noted. IMPRESSION: Chronic changes without acute abnormality. Electronically Signed   By: Alcide Clever M.D.   On: 06/17/2019 21:10   CT Maxillofacial Wo Contrast  Result Date: 06/17/2019 CLINICAL DATA:  Fall today.  Diffuse pain.  Pain all over. EXAM: CT MAXILLOFACIAL WITHOUT CONTRAST TECHNIQUE: Multidetector CT imaging of the maxillofacial structures was performed. Multiplanar CT image reconstructions were also generated. COMPARISON:  Face CT 03/22/2019 FINDINGS: Motion artifact limits detailed assessment. Osseous: Acute depressed left nasal bone fracture. Slight rightward nasal septal deviation. No evidence of acute fracture of the zygomatic arches or mandibles, allowing for motion. Poor dentition with multiple missing teeth. Temporomandibular joints are congruent. Orbits: No acute orbital fracture. No evidence of globe injury. Bilateral cataract resection. Sinuses: No sinus fracture or  fluid level. Minimal opacification of lower left mastoid air cells. Soft tissues: Paranasal soft tissue edema.  Otherwise negative. Limited intracranial: Assessed on concurrent head CT, reported separately. IMPRESSION: Acute depressed left nasal bone fracture. Electronically Signed   By: Narda Rutherford M.D.   On: 06/17/2019  21:11    Procedures Procedures (including critical care time)  Medications Ordered in ED Medications  morphine 2 MG/ML injection 2 mg (2 mg Intravenous Given 06/17/19 2102)  HYDROmorphone (DILAUDID) injection 1 mg (1 mg Intravenous Given 06/17/19 2140)    ED Course  I have reviewed the triage vital signs and the nursing notes.  Pertinent labs & imaging results that were available during my care of the patient were reviewed by me and considered in my medical decision making (see chart for details).    MDM Rules/Calculators/A&P                         69 year old with fall up the stairs On presentation, the patient is alert and oriented, in mild distress, visibly in pain, but is easily able to recall her fall and the circumstances surrounding it.  Physical exam with left hand deformity, good radial pulses, soft and nontender abdomen.  No visible cranial deformities.  She is moving her right arm and lower extremities without difficulty, strength intact.  Patient states that she has chronic left-sided lower leg numbness which has been unchanged.  No numbness to her right leg.  She is slightly hypertensive and tachypneic on exam, but other vitals are reassuring.  We will start with CT of head, maxillofacial, and C-spine, plain films of her left hand and forearm, chest  along with basic lab work.  CT of head, C-spine without acute abnormalities. EKG NSR but w/ QTC of 506.  X-ray of left hand and forearm with chronic changes but no acute fractures.  Chest x-ray without acute abnormalities.  Most notably her CT maxillofacial shows an acute depressed left nasal bone fracture.  No  evidence of open fracture. On my reexamination, patient still complaining of pain after 2 mg of morphine.  Per PDMP review, she is chronically on oxycodone 15 mg daily.  We will try some Dilaudid, and apply Ace wrap to left hand with compression.  I suspect her fall was mechanical in nature and not of any neurologic or cardiac cause.  Do not think any additional further cardiac or neurological work-up is indicated at this time.  Lab work overall reassuring, without evidence of infection,  electrolyte abnormalities, renal dysfunction.  UA with questionable evidence of UTI, will send off for culture.  Patient denies any dysuria, states that she has been having some urinary frequency but this is chronic.  On reevaluation, patient states that she still in pain, after which I explained to her that because of her high dose of home opioid medication, her medications are likely not going to work.  We discussed her going home and taking her home dose of oxycodone to which she is agreeable to.  She is overall reassured by the work-up.  ENT referral provided and encouraged.  Return precautions given.  All questions of and answered to her and her husband satisfaction, they voiced understanding and are agreeable to the plan.  At this stage in the ED course, the patient is medically screened and is stable for discharge.  Patient was seen and evaluated by Dr. Pilar Plate who is agreeable to the above plan disposition.   Final Clinical Impression(s) / ED Diagnoses Final diagnoses:  Fall, initial encounter  Closed fracture of nasal bone, initial encounter    Rx / DC Orders ED Discharge Orders    None         Leone Brand 06/17/19 2239    Kennis Carina  M, MD 06/17/19 2329

## 2019-06-17 NOTE — ED Notes (Signed)
Patient verbalizes understanding of discharge instructions. Opportunity for questioning and answers were provided. Armband removed by staff, pt discharged from ED in wheelchair to home.   

## 2019-06-17 NOTE — ED Notes (Signed)
ED Provider at bedside. 

## 2019-06-19 LAB — URINE CULTURE

## 2020-05-04 DEATH — deceased

## 2020-10-12 IMAGING — CT CT MAXILLOFACIAL W/O CM
4 series · 17 of 47 positions shown, 19 images · non-contrast
Comparison: Face CT 03/22/2019

CLINICAL DATA: Fall today.  Diffuse pain.  Pain all over.

EXAM:
CT MAXILLOFACIAL WITHOUT CONTRAST
TECHNIQUE: Multidetector CT imaging of the maxillofacial structures was
performed. Multiplanar CT image reconstructions were also generated.

[Series 3: facial/ orbits 2.0 h30s · axial · 0.37mm/px · z∈[-203,-77]mm · 8 of 82 slices shown, 10 images]
[im 10/82  brain]
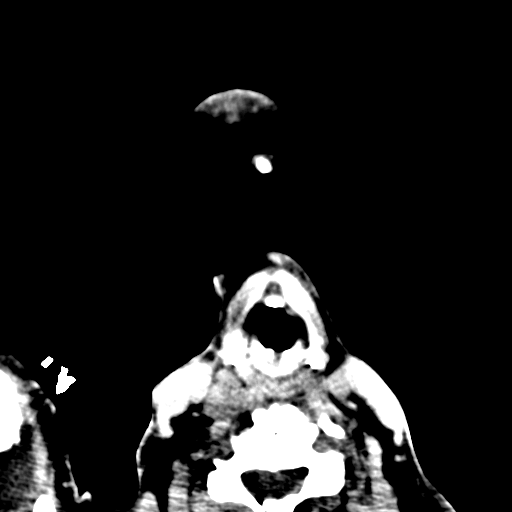
[im 10/82  bone]
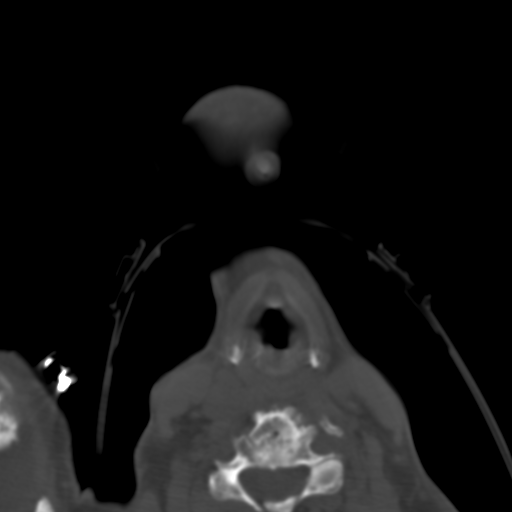
[im 19/82  bone]
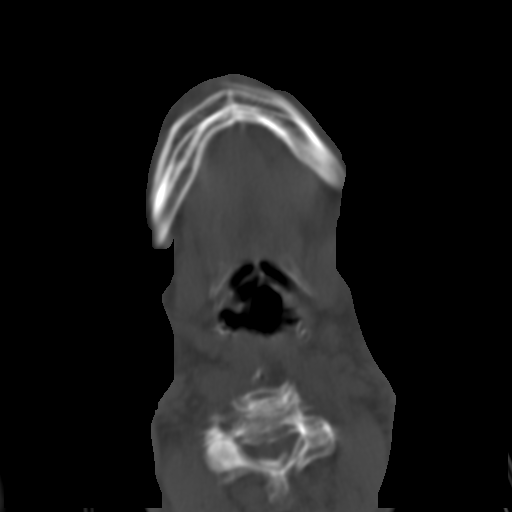
[im 28/82  bone]
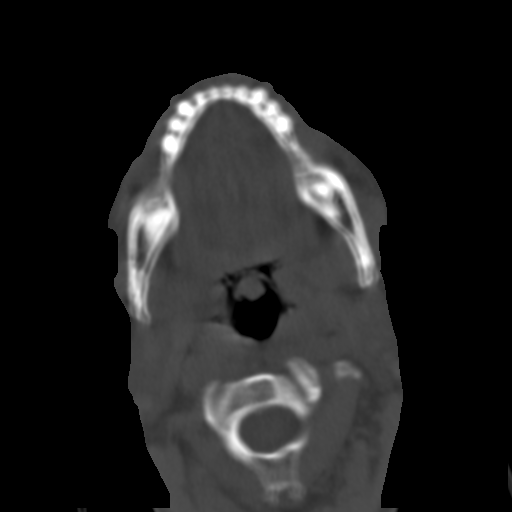
[im 37/82  bone]
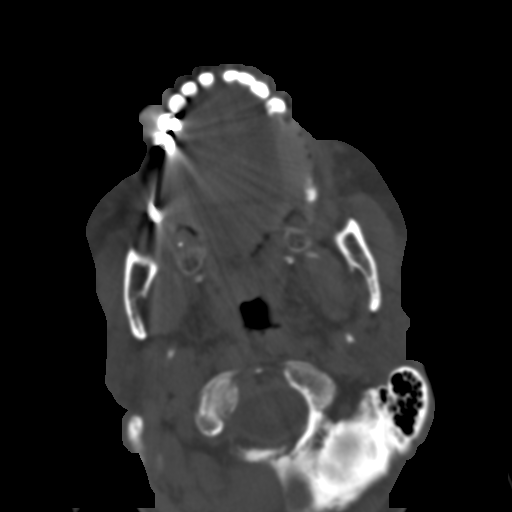
[im 46/82  brain]
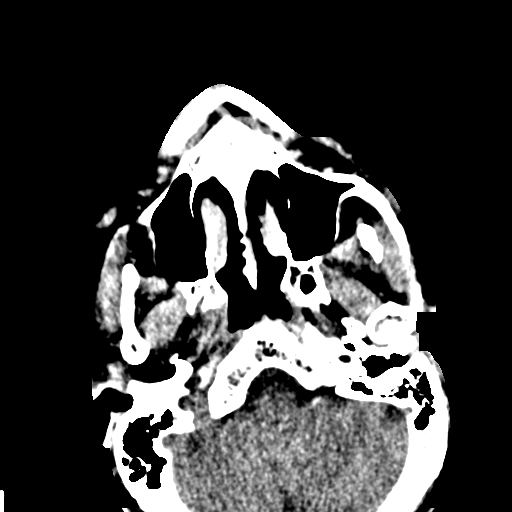
[im 46/82  bone]
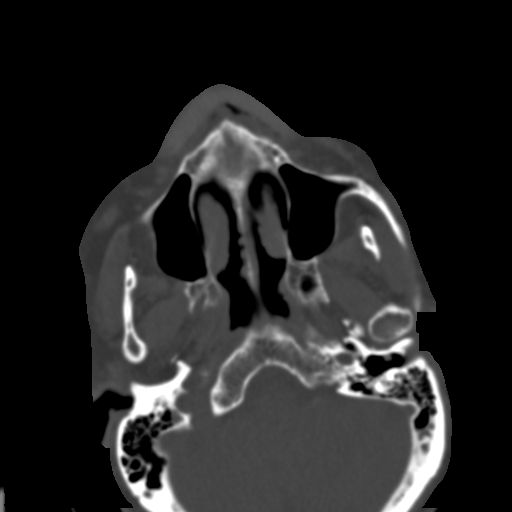
[im 55/82  bone]
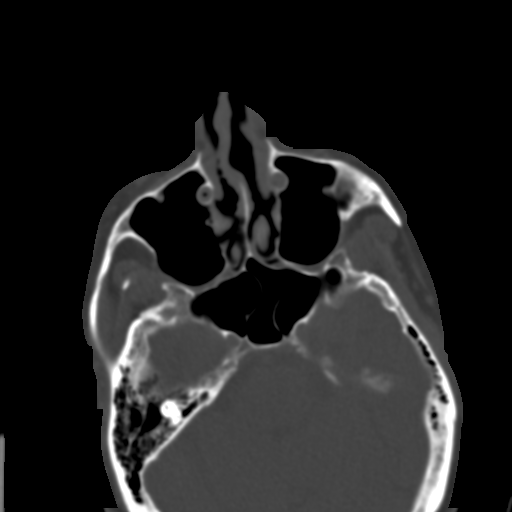
[im 64/82  bone]
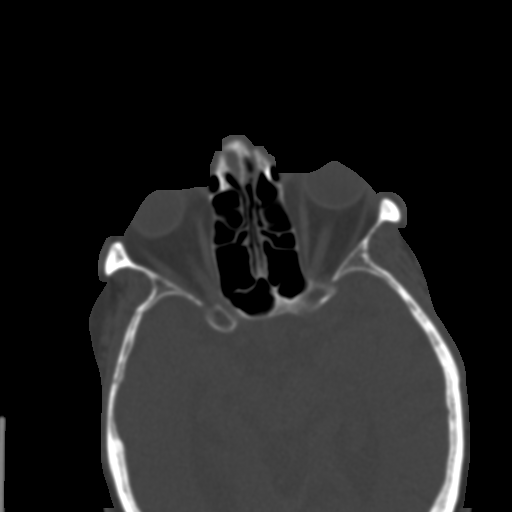
[im 73/82  bone]
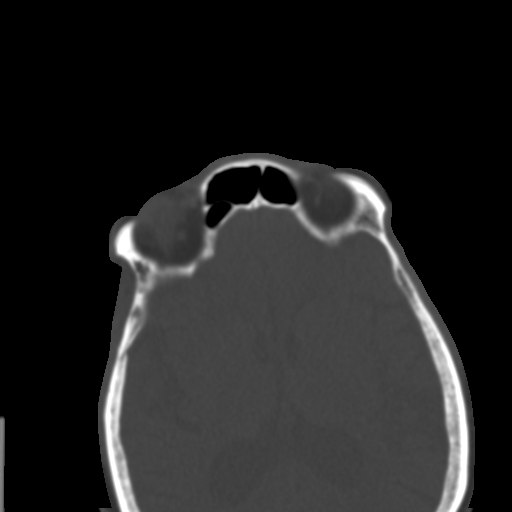

[Series 5: 1.0 thin soft tissue · axial · 0.37mm/px · z∈[-205,-171]mm · 3 of 164 slices shown]
[im 18/164  brain]
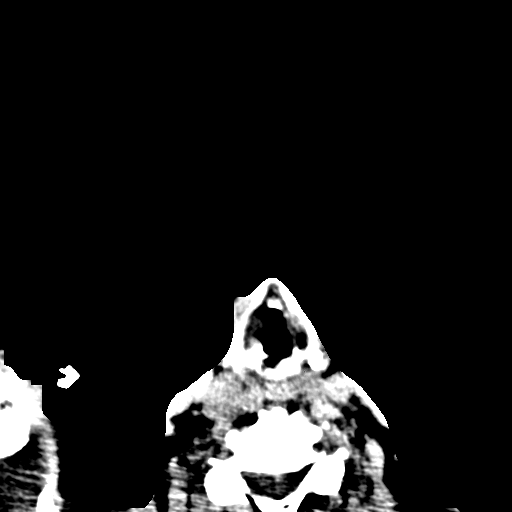
[im 35/164  brain]
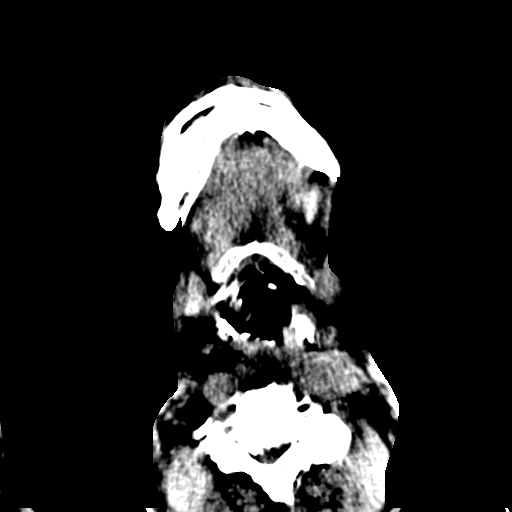
[im 52/164  brain]
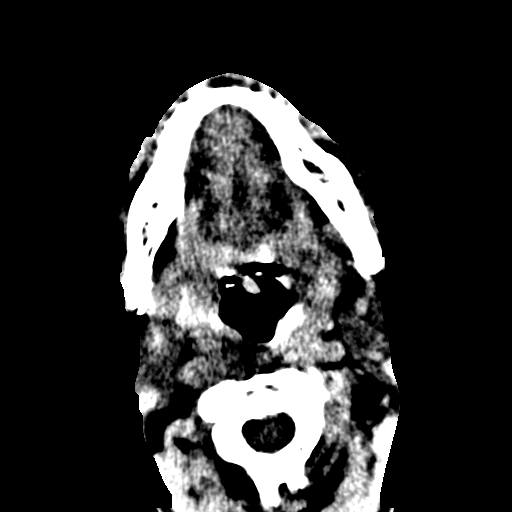

[Series 7: coronal soft tissue · coronal · 0.34mm/px · 3 of 77 slices shown]
[im 26/77  bone]
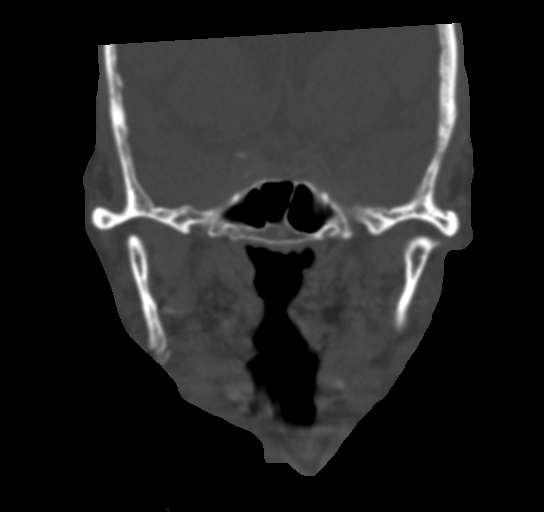
[im 34/77  bone]
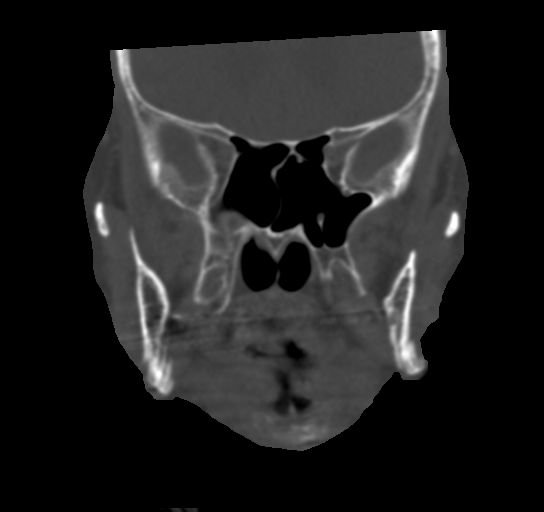
[im 43/77  bone]
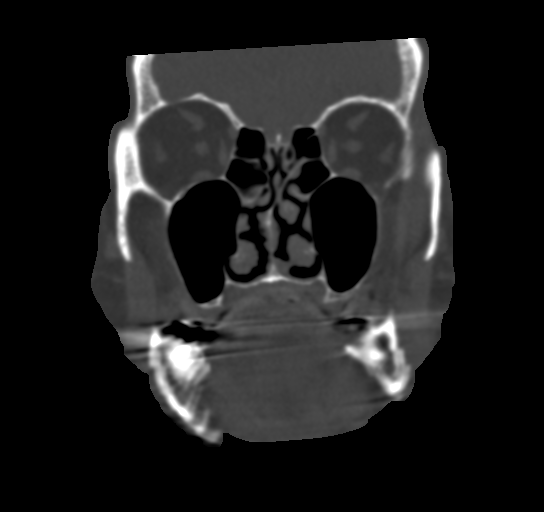

[Series 8: sagittal soft tissue · sagittal · 0.36mm/px · 3 of 76 slices shown]
[im 26/76  bone]
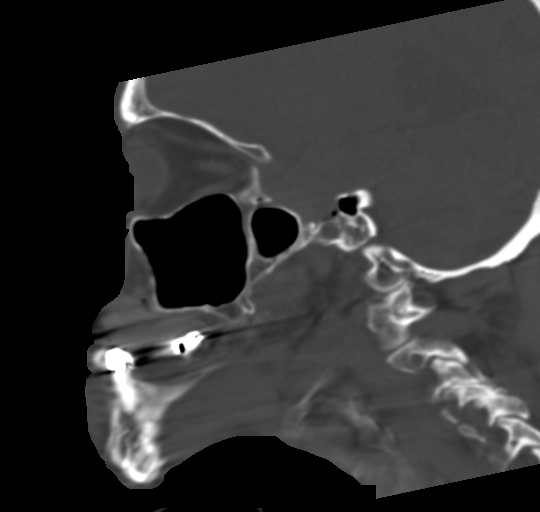
[im 38/76  bone]
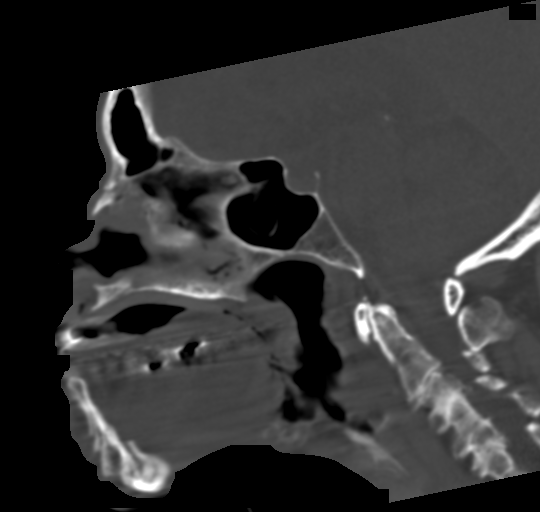
[im 51/76  bone]
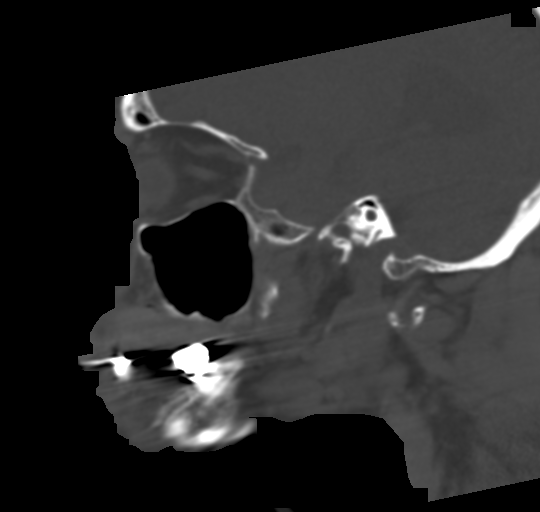

[17 of 47 positions shown; findings below may reference images not displayed]

FINDINGS: Motion artifact limits detailed assessment.

Osseous: Acute depressed left nasal bone fracture. Slight rightward
nasal septal deviation. No evidence of acute fracture of the
zygomatic arches or mandibles, allowing for motion. Poor dentition
with multiple missing teeth. Temporomandibular joints are congruent.

Orbits: No acute orbital fracture. No evidence of globe injury.
Bilateral cataract resection.

Sinuses: No sinus fracture or fluid level. Minimal opacification of
lower left mastoid air cells.

Soft tissues: Paranasal soft tissue edema.  Otherwise negative.

Limited intracranial: Assessed on concurrent head CT, reported
separately.
IMPRESSION: Acute depressed left nasal bone fracture.

## 2020-10-12 IMAGING — CT CT HEAD W/O CM
3 series · 15 of 45 positions shown, 18 images · non-contrast
Comparison: Head and face CT 03/22/2019

CLINICAL DATA: Fall today with diffuse pain.

EXAM:
CT HEAD WITHOUT CONTRAST
TECHNIQUE: Contiguous axial images were obtained from the base of the skull
through the vertex without intravenous contrast.

[Series 3: head 5.0 h30s · axial · 0.39mm/px · z∈[-128,-13]mm · 9 of 28 slices shown, 12 images]
[im 3/28  brain]
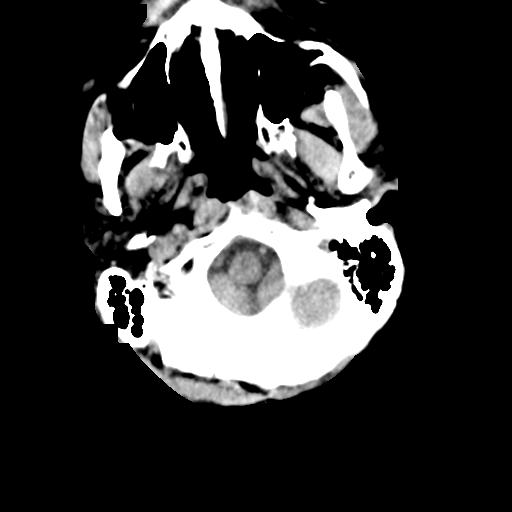
[im 3/28  bone]
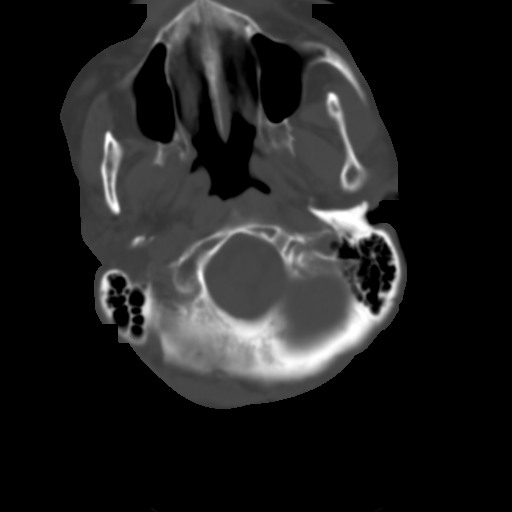
[im 6/28  brain]
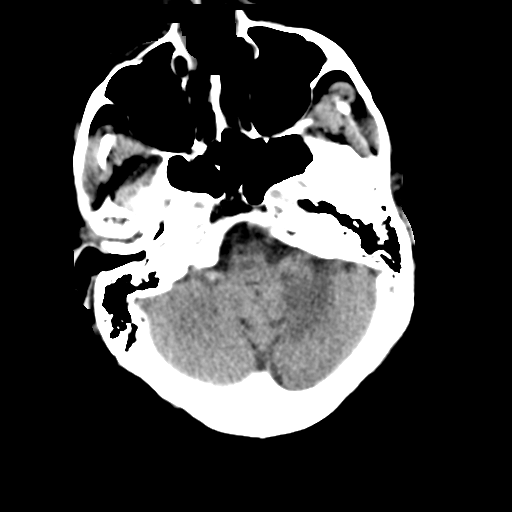
[im 9/28  brain]
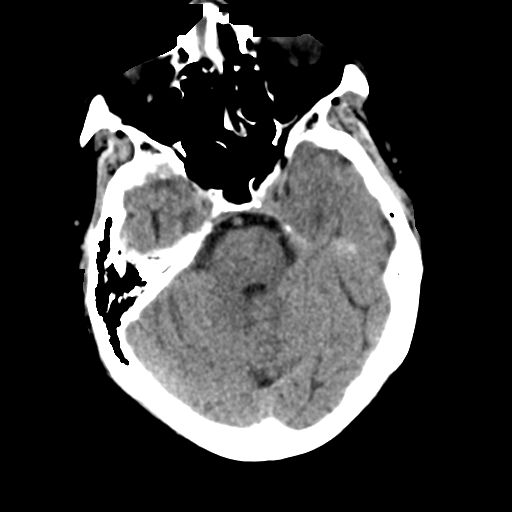
[im 12/28  brain]
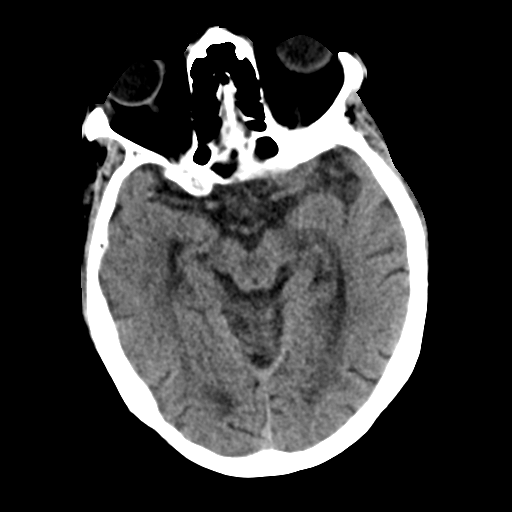
[im 15/28  brain]
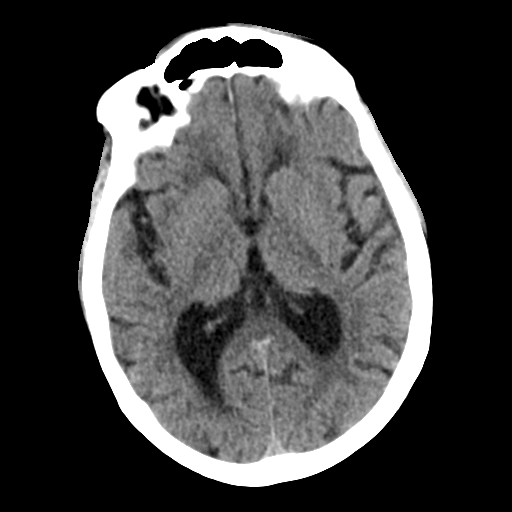
[im 15/28  bone]
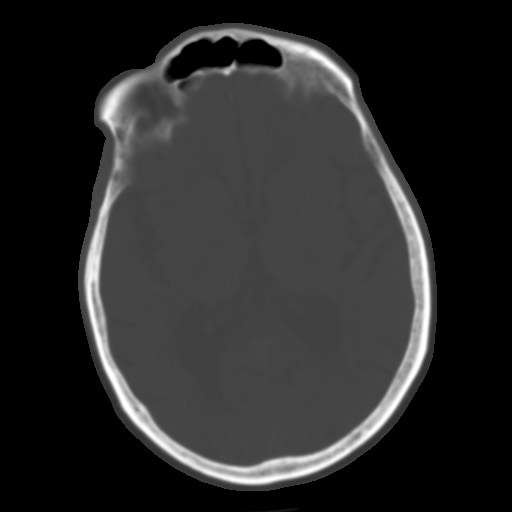
[im 17/28  brain]
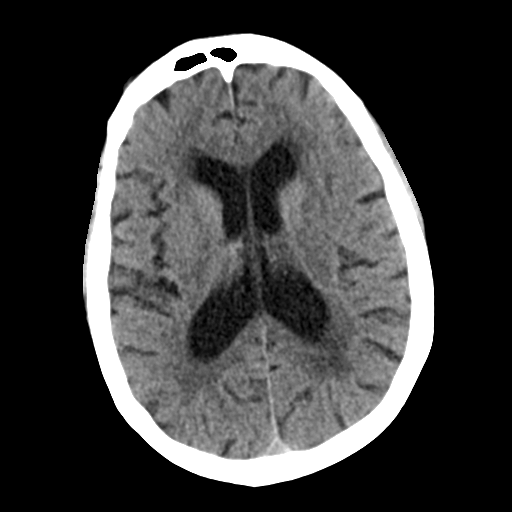
[im 20/28  brain]
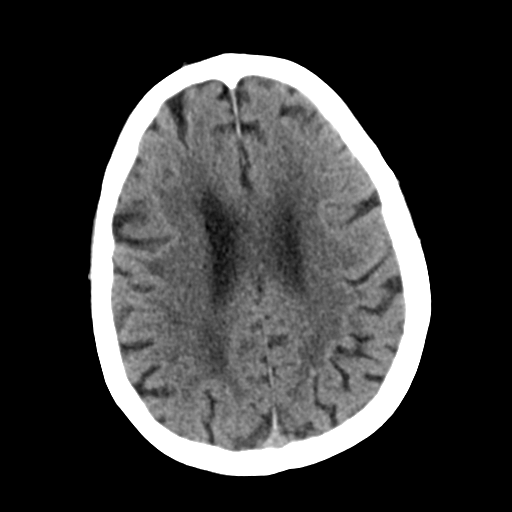
[im 23/28  brain]
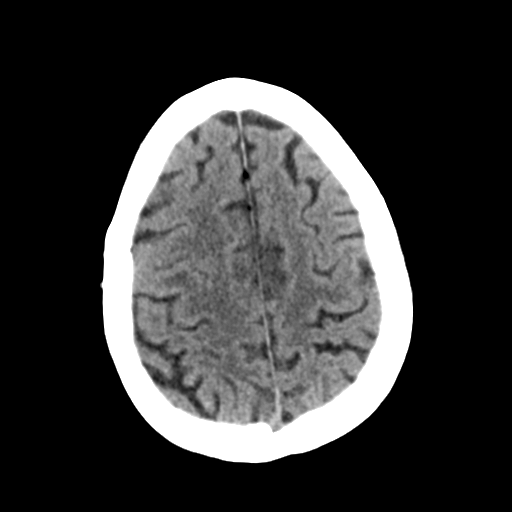
[im 26/28  brain]
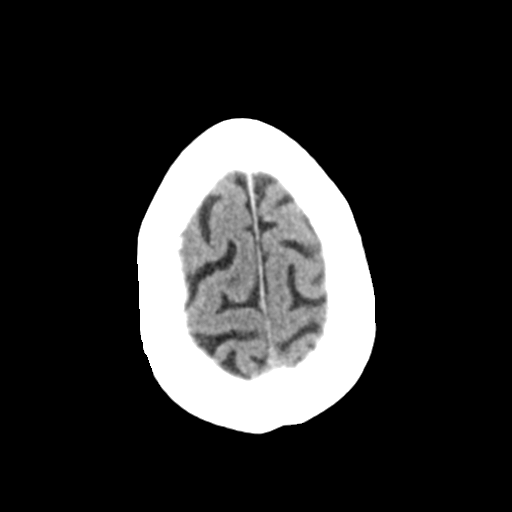
[im 26/28  bone]
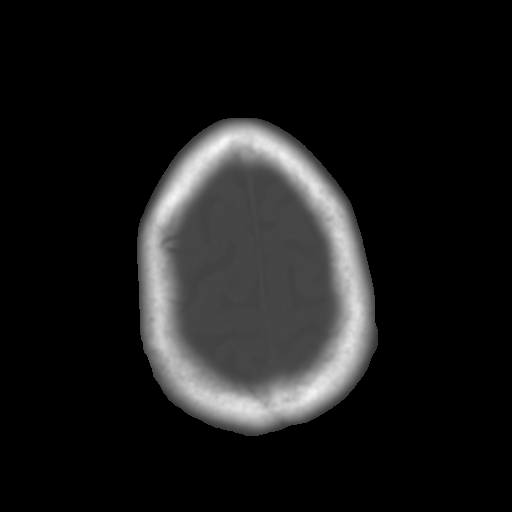

[Series 5: head 3.0 mpr cor · coronal · 0.27mm/px · 3 of 67 slices shown]
[im 23/67  brain]
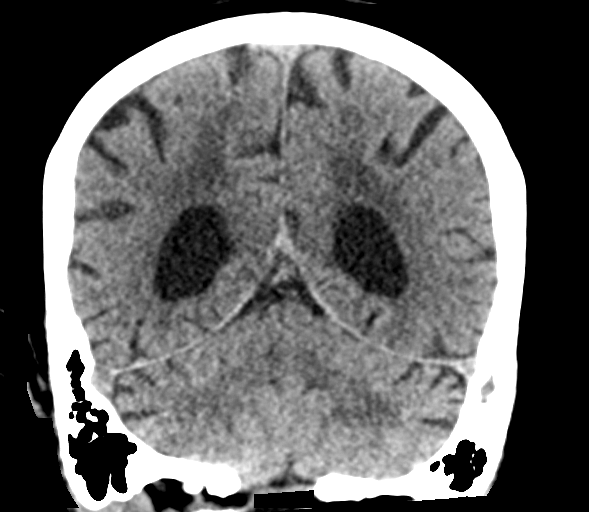
[im 30/67  brain]
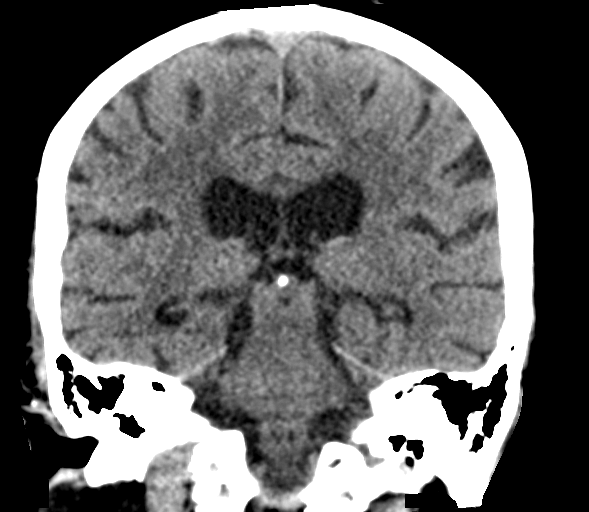
[im 37/67  brain]
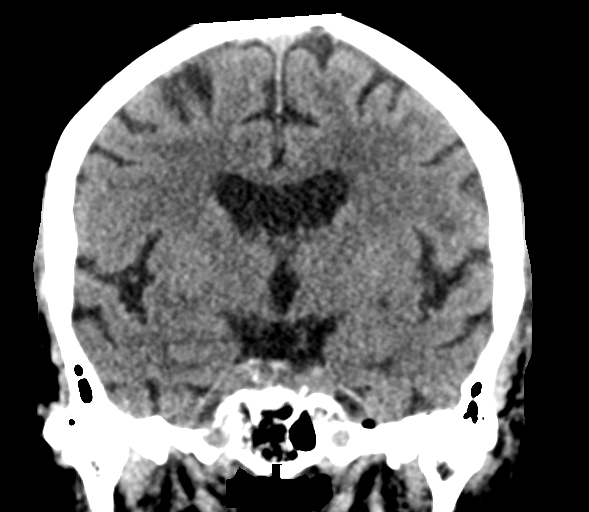

[Series 6: head 3.0 mpr sag · sagittal · 0.30mm/px · 3 of 56 slices shown]
[im 19/56  brain]
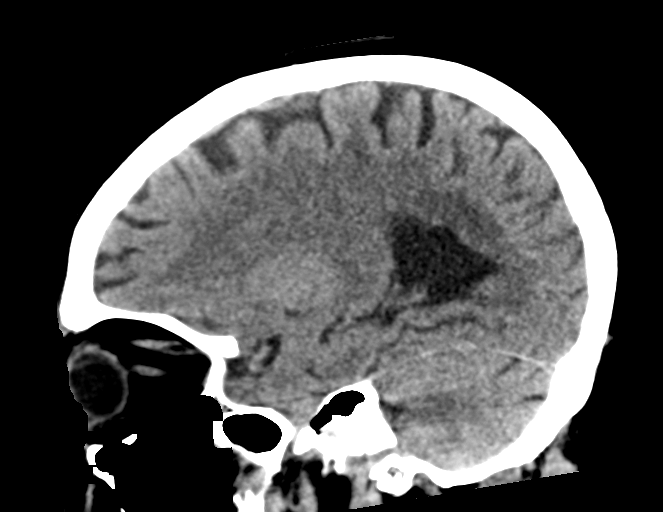
[im 28/56  brain]
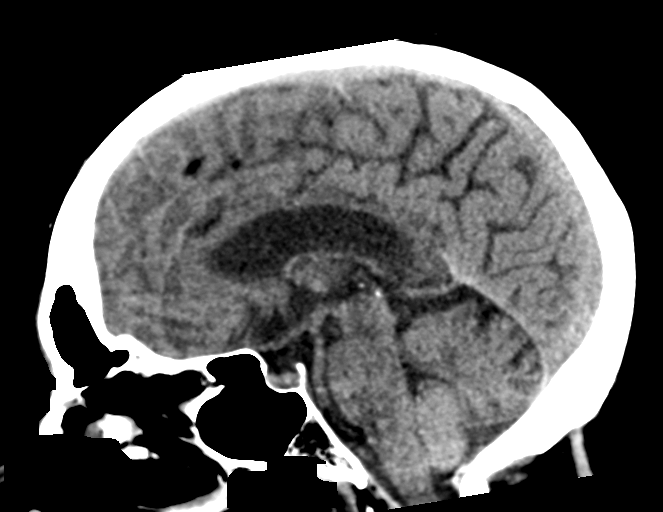
[im 37/56  brain]
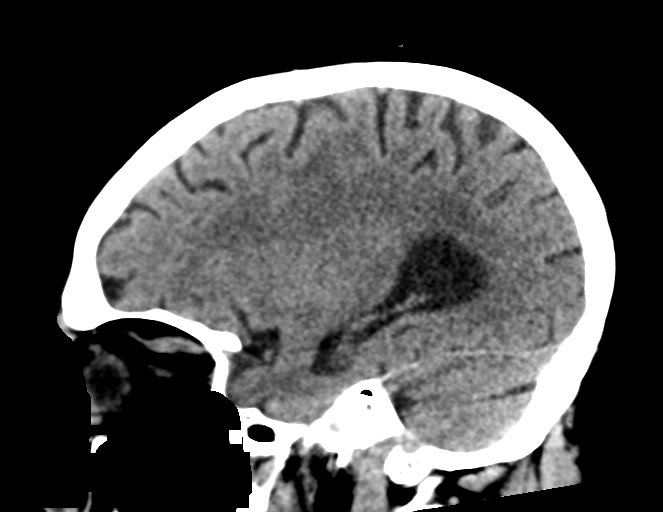

[15 of 45 positions shown; findings below may reference images not displayed]

FINDINGS: Brain: No intracranial hemorrhage, mass effect, or midline shift.
Stable degree of atrophy, mildly advanced for age. Stable degree of
chronic small vessel ischemia. No hydrocephalus. The basilar
cisterns are patent. No evidence of territorial infarct or acute
ischemia. No extra-axial or intracranial fluid collection.

Vascular: No hyperdense vessel or unexpected calcification.

Skull: No fracture or focal lesion.

Sinuses/Orbits: Assessed on concurrent face CT, reported separately.

Other: None.
IMPRESSION: 1. No acute intracranial abnormality. No skull fracture.
2. Stable atrophy and chronic small vessel ischemia.
